# Patient Record
Sex: Male | Born: 1970 | Race: Black or African American | Hispanic: No | Marital: Married | Smoking: Former smoker
Health system: Southern US, Community
[De-identification: ages and names within clinical notes are randomized; demographics above are authoritative.]

## PROBLEM LIST (undated history)

## (undated) DIAGNOSIS — N200 Calculus of kidney: Secondary | ICD-10-CM

## (undated) DIAGNOSIS — Z87442 Personal history of urinary calculi: Secondary | ICD-10-CM

## (undated) HISTORY — PX: URETERAL STENT PLACEMENT: SHX822

---

## 2004-11-02 ENCOUNTER — Emergency Department (HOSPITAL_COMMUNITY): Admission: EM | Admit: 2004-11-02 | Discharge: 2004-11-02 | Payer: Self-pay | Admitting: Emergency Medicine

## 2005-11-03 ENCOUNTER — Emergency Department (HOSPITAL_COMMUNITY): Admission: EM | Admit: 2005-11-03 | Discharge: 2005-11-03 | Payer: Self-pay | Admitting: Emergency Medicine

## 2005-12-08 ENCOUNTER — Emergency Department (HOSPITAL_COMMUNITY): Admission: EM | Admit: 2005-12-08 | Discharge: 2005-12-08 | Payer: Self-pay | Admitting: Emergency Medicine

## 2008-03-29 ENCOUNTER — Emergency Department (HOSPITAL_COMMUNITY): Admission: EM | Admit: 2008-03-29 | Discharge: 2008-03-29 | Payer: Self-pay | Admitting: Emergency Medicine

## 2008-06-25 ENCOUNTER — Emergency Department (HOSPITAL_COMMUNITY): Admission: EM | Admit: 2008-06-25 | Discharge: 2008-06-25 | Payer: Self-pay | Admitting: Emergency Medicine

## 2009-01-21 ENCOUNTER — Emergency Department (HOSPITAL_COMMUNITY): Admission: EM | Admit: 2009-01-21 | Discharge: 2009-01-21 | Payer: Self-pay | Admitting: Emergency Medicine

## 2009-01-23 ENCOUNTER — Emergency Department (HOSPITAL_COMMUNITY): Admission: EM | Admit: 2009-01-23 | Discharge: 2009-01-23 | Payer: Self-pay | Admitting: Emergency Medicine

## 2009-07-17 ENCOUNTER — Emergency Department (HOSPITAL_COMMUNITY): Admission: EM | Admit: 2009-07-17 | Discharge: 2009-07-17 | Payer: Self-pay | Admitting: Emergency Medicine

## 2010-12-06 ENCOUNTER — Emergency Department (HOSPITAL_COMMUNITY)
Admission: EM | Admit: 2010-12-06 | Discharge: 2010-12-06 | Payer: Self-pay | Source: Home / Self Care | Admitting: Emergency Medicine

## 2010-12-11 LAB — URINE MICROSCOPIC-ADD ON

## 2010-12-11 LAB — URINALYSIS, ROUTINE W REFLEX MICROSCOPIC
Bilirubin Urine: NEGATIVE
Ketones, ur: NEGATIVE mg/dL
Leukocytes, UA: NEGATIVE
Nitrite: NEGATIVE
Protein, ur: NEGATIVE mg/dL
Specific Gravity, Urine: >1.03 — ABNORMAL HIGH (ref 1.005–1.030)
Urine Glucose, Fasting: NEGATIVE mg/dL
Urobilinogen, UA: 0.2 mg/dL (ref 0.0–1.0)
pH: 5.5 (ref 5.0–8.0)

## 2010-12-11 LAB — GC/CHLAMYDIA PROBE AMP, GENITAL
Chlamydia, DNA Probe: NEGATIVE
GC Probe Amp, Genital: NEGATIVE

## 2011-01-16 ENCOUNTER — Emergency Department (HOSPITAL_COMMUNITY): Payer: Self-pay

## 2011-01-16 ENCOUNTER — Emergency Department (HOSPITAL_COMMUNITY)
Admission: EM | Admit: 2011-01-16 | Discharge: 2011-01-16 | Disposition: A | Discharge status: Home / Self Care | Payer: Self-pay | Attending: Emergency Medicine | Admitting: Emergency Medicine

## 2011-01-16 DIAGNOSIS — N200 Calculus of kidney: Secondary | ICD-10-CM | POA: Insufficient documentation

## 2011-01-16 DIAGNOSIS — Z87442 Personal history of urinary calculi: Secondary | ICD-10-CM | POA: Insufficient documentation

## 2011-01-16 DIAGNOSIS — R109 Unspecified abdominal pain: Secondary | ICD-10-CM | POA: Insufficient documentation

## 2011-01-16 LAB — BASIC METABOLIC PANEL
BUN: 15 mg/dL (ref 6–23)
CO2: 25 mEq/L (ref 19–32)
Calcium: 9.3 mg/dL (ref 8.4–10.5)
Chloride: 104 mEq/L (ref 96–112)
Creatinine, Ser: 1.32 mg/dL (ref 0.4–1.5)
GFR calc Af Amer: >60 mL/min (ref >60)
GFR calc non Af Amer: >60 mL/min (ref >60)
Glucose, Bld: 97 mg/dL (ref 70–99)
Potassium: 4 mEq/L (ref 3.5–5.1)
Sodium: 138 mEq/L (ref 135–145)

## 2011-01-16 LAB — CBC
HCT: 41.1 % (ref 39.0–52.0)
Hemoglobin: 13.5 g/dL (ref 13.0–17.0)
MCH: 27.6 pg (ref 26.0–34.0)
MCHC: 32.8 g/dL (ref 30.0–36.0)
MCV: 83.9 fL (ref 78.0–100.0)
Platelets: 273 10*3/uL (ref 150–400)
RBC: 4.9 MIL/uL (ref 4.22–5.81)
RDW: 13.7 % (ref 11.5–15.5)
WBC: 13 10*3/uL — ABNORMAL HIGH (ref 4.0–10.5)

## 2011-01-16 LAB — URINE MICROSCOPIC-ADD ON

## 2011-01-16 LAB — DIFFERENTIAL
Basophils Absolute: 0.1 10*3/uL (ref 0.0–0.1)
Basophils Relative: 1 % (ref 0–1)
Eosinophils Absolute: 0.5 10*3/uL (ref 0.0–0.7)
Eosinophils Relative: 4 % (ref 0–5)
Lymphocytes Relative: 27 % (ref 12–46)
Lymphs Abs: 3.6 10*3/uL (ref 0.7–4.0)
Monocytes Absolute: 1.4 10*3/uL — ABNORMAL HIGH (ref 0.1–1.0)
Monocytes Relative: 11 % (ref 3–12)
Neutro Abs: 7.6 10*3/uL (ref 1.7–7.7)
Neutrophils Relative %: 58 % (ref 43–77)

## 2011-01-16 LAB — URINALYSIS, ROUTINE W REFLEX MICROSCOPIC
Bilirubin Urine: NEGATIVE
Ketones, ur: NEGATIVE mg/dL
Leukocytes, UA: NEGATIVE
Nitrite: NEGATIVE
Protein, ur: NEGATIVE mg/dL
Specific Gravity, Urine: 1.022 (ref 1.005–1.030)
Urine Glucose, Fasting: NEGATIVE mg/dL
Urobilinogen, UA: 0.2 mg/dL (ref 0.0–1.0)
pH: 5 (ref 5.0–8.0)

## 2013-07-15 ENCOUNTER — Encounter (HOSPITAL_COMMUNITY): Payer: Self-pay | Admitting: Emergency Medicine

## 2013-07-15 ENCOUNTER — Emergency Department (HOSPITAL_COMMUNITY)
Admission: EM | Admit: 2013-07-15 | Discharge: 2013-07-15 | Disposition: A | Discharge status: Home / Self Care | Payer: BC Managed Care – PPO | Attending: Emergency Medicine | Admitting: Emergency Medicine

## 2013-07-15 DIAGNOSIS — Z87442 Personal history of urinary calculi: Secondary | ICD-10-CM | POA: Insufficient documentation

## 2013-07-15 DIAGNOSIS — M25519 Pain in unspecified shoulder: Secondary | ICD-10-CM | POA: Insufficient documentation

## 2013-07-15 DIAGNOSIS — Z79899 Other long term (current) drug therapy: Secondary | ICD-10-CM | POA: Insufficient documentation

## 2013-07-15 DIAGNOSIS — Z880 Allergy status to penicillin: Secondary | ICD-10-CM | POA: Insufficient documentation

## 2013-07-15 DIAGNOSIS — M2559 Pain in other specified joint: Secondary | ICD-10-CM | POA: Insufficient documentation

## 2013-07-15 HISTORY — DX: Calculus of kidney: N20.0

## 2013-07-15 MED ORDER — IBUPROFEN 800 MG PO TABS: 800.0000 mg | ORAL_TABLET | Freq: Three times a day (TID) | ORAL | Status: DC

## 2013-07-15 NOTE — ED Notes (Signed)
Patient complaining of bilateral shoulder pain x 3 months. States he was seen by PCP and x-rays were negative. Denies injury.

## 2013-07-17 NOTE — ED Provider Notes (Signed)
CSN: [id number]     Arrival date & time 07/15/13  2117 History     First MD Initiated Contact with Patient 07/15/13 2130     Chief Complaint  Patient presents with  . Shoulder Pain   (Consider location/radiation/quality/duration/timing/severity/associated sxs/prior Treatment) HPI Comments: Michael Bruce is a 42 y.o. Male presenting with complaints of pain in his bilateral shoulders which has been worse for the past 3 months and is worse with palpation and with movement and lifting.  He denies injury.  He was seen by his pcp for this complaint one month ago and had xrays which patient reports were normal xrays.  He was given samples and a prescription by his pcp for celebrex which he was reluctant to take because he doesn't like to take medicine and was particularly leery of celebrex after reading the warning label.  He does however take occasional ibuprofen and does feel better after taking this.  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 He denies chest pain and shortness of breath.  Pain is relieved with resting the joints.     The history is provided by the patient.    Past Medical History  Diagnosis Date  . Kidney stones    History reviewed. No pertinent past surgical history. History reviewed. No pertinent family history. History  Substance Use Topics  . Smoking status: Never Smoker   . Smokeless tobacco: Not on file  . Alcohol Use: No    Review of Systems  Constitutional: Negative for fever and chills.  Musculoskeletal: Positive for arthralgias. Negative for myalgias and joint swelling.  Neurological: Negative for weakness and numbness.    Allergies  Penicillins  Home Medications   Current Outpatient Rx  Name  Route  Sig  Dispense  Refill  . ibuprofen (ADVIL,MOTRIN) 200 MG tablet   Oral   Take 200 mg by mouth every 6 (six) hours as needed for pain or fever (for pain).          . Multiple Vitamin (MULTIVITAMIN WITH MINERALS) TABS tablet   Oral   Take 1 tablet by mouth daily.         Marland Kitchen ibuprofen (ADVIL,MOTRIN) 800 MG tablet   Oral   Take 1 tablet (800 mg total) by mouth 3 (three) times daily.   21 tablet   0    BP 126/81  Pulse 74  Temp(Src) 98.2 F (36.8 C) (Oral)  Resp 20  Ht 5\' 7"  (1.702 m)  Wt 168 lb (76.204 kg)  BMI 26.31 kg/m2  SpO2 100% Physical Exam  Constitutional: He appears well-developed and well-nourished.  HENT:  Head: Atraumatic.  Neck: Normal range of motion.  Cardiovascular:  Pulses equal bilaterally  Musculoskeletal: He exhibits tenderness. He exhibits no edema.  Point tender over both AC joints without edema or deformity.  Pt has FROM of shoulders with mild discomfort.  Equal grip strength. Less than 3 sec cap refill.  Neurological: He is alert. He has normal strength. He displays normal reflexes. No sensory deficit.  Equal strength  Skin: Skin is warm and dry.  Psychiatric: He has a normal mood and affect.    ED Course   Procedures (including critical care time)  Labs Reviewed - No data to display No results found. 1. AC joint pain, unspecified laterality     MDM  Suspected chronic ac joint inflammation, pt had normal films by pcp, not repeated today.  He was encouraged to increase ibuprofen dose,  Prescribed 800 mg strength.  Also encouraged heat tx tid.  Recommended recheck by pcp, also given referral to ortho for further eval if sx are not improving with this tx.  Burgess Amor, PA-C 07/17/13 [phone]

## 2013-07-18 NOTE — ED Provider Notes (Signed)
Medical screening examination/treatment/procedure(s) were performed by non-physician practitioner and as supervising physician I was immediately available for consultation/collaboration.   Dagmar Hait, MD 07/18/13 [phone]

## 2018-10-12 ENCOUNTER — Emergency Department (HOSPITAL_COMMUNITY): Payer: BLUE CROSS/BLUE SHIELD

## 2018-10-12 ENCOUNTER — Emergency Department (HOSPITAL_COMMUNITY)
Admission: EM | Admit: 2018-10-12 | Discharge: 2018-10-12 | Disposition: A | Discharge status: Home / Self Care | Payer: BLUE CROSS/BLUE SHIELD | Attending: Emergency Medicine | Admitting: Emergency Medicine

## 2018-10-12 ENCOUNTER — Encounter (HOSPITAL_COMMUNITY): Payer: Self-pay

## 2018-10-12 DIAGNOSIS — R0789 Other chest pain: Secondary | ICD-10-CM | POA: Insufficient documentation

## 2018-10-12 DIAGNOSIS — G44221 Chronic tension-type headache, intractable: Secondary | ICD-10-CM | POA: Diagnosis not present

## 2018-10-12 DIAGNOSIS — G44201 Tension-type headache, unspecified, intractable: Secondary | ICD-10-CM

## 2018-10-12 DIAGNOSIS — R51 Headache: Secondary | ICD-10-CM | POA: Diagnosis not present

## 2018-10-12 LAB — CBC
HCT: 49.3 % (ref 39.0–52.0)
Hemoglobin: 15.4 g/dL (ref 13.0–17.0)
MCH: 27.3 pg (ref 26.0–34.0)
MCHC: 31.2 g/dL (ref 30.0–36.0)
MCV: 87.4 fL (ref 80.0–100.0)
Platelets: 291 10*3/uL (ref 150–400)
RBC: 5.64 MIL/uL (ref 4.22–5.81)
RDW: 13.2 % (ref 11.5–15.5)
WBC: 13 10*3/uL — ABNORMAL HIGH (ref 4.0–10.5)
nRBC: 0 % (ref 0.0–0.2)

## 2018-10-12 LAB — BASIC METABOLIC PANEL
Anion gap: 8 (ref 5–15)
BUN: 11 mg/dL (ref 6–20)
CO2: 24 mmol/L (ref 22–32)
Calcium: 9.4 mg/dL (ref 8.9–10.3)
Chloride: 102 mmol/L (ref 98–111)
Creatinine, Ser: 1.14 mg/dL (ref 0.61–1.24)
GFR calc Af Amer: >60 mL/min (ref >60)
GFR calc non Af Amer: >60 mL/min (ref >60)
Glucose, Bld: 112 mg/dL — ABNORMAL HIGH (ref 70–99)
Potassium: 3.7 mmol/L (ref 3.5–5.1)
Sodium: 134 mmol/L — ABNORMAL LOW (ref 135–145)

## 2018-10-12 LAB — URINALYSIS, ROUTINE W REFLEX MICROSCOPIC
Bilirubin Urine: NEGATIVE
Glucose, UA: NEGATIVE mg/dL
Hgb urine dipstick: NEGATIVE
Ketones, ur: NEGATIVE mg/dL
Leukocytes, UA: NEGATIVE
Nitrite: NEGATIVE
Protein, ur: NEGATIVE mg/dL
Specific Gravity, Urine: 1.028 (ref 1.005–1.030)
pH: 6 (ref 5.0–8.0)

## 2018-10-12 LAB — I-STAT TROPONIN, ED: Troponin i, poc: 0 ng/mL (ref 0.00–0.08)

## 2018-10-12 MED ORDER — METOCLOPRAMIDE HCL 5 MG/ML IJ SOLN
10.0000 mg | Freq: Once | INTRAMUSCULAR | Status: AC
  Administered 2018-10-12: 10 mg via INTRAVENOUS
  Filled 2018-10-12: qty 2

## 2018-10-12 MED ORDER — DEXAMETHASONE SODIUM PHOSPHATE 4 MG/ML IJ SOLN
4.0000 mg | Freq: Once | INTRAMUSCULAR | Status: AC
  Administered 2018-10-12: 4 mg via INTRAVENOUS
  Filled 2018-10-12: qty 1

## 2018-10-12 NOTE — ED Triage Notes (Addendum)
Pt arrived with c/o CP for last few months; yesterday pt states he was fatigued with backache and HA; Pt denies current CP but states CP was centralized and sharp, radiating to neck. Pt states that he took Viagra 3 days ago. Pt states episodes last for approx 5 mins.

## 2018-10-12 NOTE — Discharge Instructions (Addendum)
You were seen in the emergency department today for chest pain and headache. Your work-up in the emergency department has been overall reassuring. Your labs have been fairly normal and or similar to previous blood work you have had done. Your EKG and the enzyme we use to check your heart did not show an acute heart attack at this time. Your chest x-ray was normal. Your head CT was normal.   We would like you to follow up closely with your primary care provider and/or the cardiologist provided in your discharge instructions within 1-3 days. Obtain all your results from this visit and discuss them with your PCP. Take over the counter ibuprofen as needed for headaches. Return to the ER immediately should you experience any new or worsening symptoms including but not limited to return of pain, worsened pain, vomiting, shortness of breath, dizziness, lightheadedness, passing out, or any other concerns that you may have.

## 2018-10-12 NOTE — ED Provider Notes (Signed)
[facility] EMERGENCY DEPARTMENT Provider Note   CSN: [id number] Arrival date & time: 10/12/18  0755     History   Chief Complaint Chief Complaint  Patient presents with  . Chest Pain    HPI Michael Bruce is a 47 y.o. male presenting with a gradual constant headache onset yesterday morning.Patient describes headache as worse than other previous headaches. Patient describes headache as pressure located in the back on his head bilaterally. Patient denies any current neck pain. Patient reports chronic back pain and fatigue. Patient denies taking any medications for his pain. Patient denies vision changes, weakness, or syncope.  Patient denies any recent surgeries or periods of immobilization. Patient denies fever, chills, or night sweats.  Patient also reports he has had intermittent chest pain that radiates to his neck over the last few months. Patient localizes chest pain to the middle of the chest. Patient describes chest pain as sharp episodes that last 5 minutes and resolve spontaneously. Chest pain is non exertional. Last episode of chest pain was yesterday. Patient reports he took Viagra once 3 days ago, but denies taking this medication again. Patient denies any treatment for his chest pain. Patient denies shortness of breath, current chest pain, extremity edema, or palpitations. Patient denies a family history of heart problems. Patient denies tobacco use. Patient reports daily marijuana use, but denies any other drug use.   HPI  Past Medical History:  Diagnosis Date  . Kidney stones     There are no active problems to display for this patient.   History reviewed. No pertinent surgical history.      Home Medications    Prior to Admission medications   Medication Sig Start Date End Date Taking? Authorizing Provider  ibuprofen (ADVIL,MOTRIN) 800 MG tablet Take 1 tablet (800 mg total) by mouth 3 (three) times daily. Patient not taking: Reported on  10/12/2018 07/15/13   Burgess Amor, PA-C    Family History History reviewed. No pertinent family history.  Social History Social History   Tobacco Use  . Smoking status: Never Smoker  Substance Use Topics  . Alcohol use: No  . Drug use: No     Allergies   Penicillins   Review of Systems Review of Systems  Constitutional: Positive for fatigue. Negative for activity change, appetite change, chills, diaphoresis, fever and unexpected weight change.  HENT: Negative for congestion and rhinorrhea.   Eyes: Negative for visual disturbance.  Respiratory: Negative for cough, chest tightness, shortness of breath and wheezing.   Cardiovascular: Positive for chest pain. Negative for palpitations and leg swelling.  Gastrointestinal: Negative for abdominal pain, nausea and vomiting.  Endocrine: Negative for cold intolerance and heat intolerance.  Genitourinary: Positive for frequency. Negative for dysuria.  Musculoskeletal: Positive for back pain. Negative for neck pain and neck stiffness.  Skin: Negative for rash.  Allergic/Immunologic: Negative for immunocompromised state.  Neurological: Positive for headaches. Negative for dizziness, syncope, weakness, light-headedness and numbness.  Psychiatric/Behavioral: Positive for sleep disturbance. Negative for agitation and behavioral problems. The patient is not nervous/anxious.      Physical Exam Updated Vital Signs BP (!) 133/99   Pulse (!) 102   Temp 98.9 F (37.2 C) (Oral)   Resp (!) 23   SpO2 94%   Physical Exam  Constitutional: He is oriented to person, place, and time. He appears well-developed and well-nourished. No distress.  HENT:  Head: Normocephalic and atraumatic.  Neck: Normal range of motion. Neck supple. No JVD present.  Cardiovascular:  Regular rhythm, normal heart sounds and normal pulses. Tachycardia present. Exam reveals no gallop and no friction rub.  No murmur heard. Pulses:      Radial pulses are 2+ on the right  side, and 2+ on the left side.       Dorsalis pedis pulses are 2+ on the right side, and 2+ on the left side.  Pulmonary/Chest: Effort normal and breath sounds normal. No respiratory distress. He has no wheezes. He has no rales. He exhibits no tenderness.  Abdominal: Soft. There is no tenderness.  Musculoskeletal: Normal range of motion.       Cervical back: Normal. He exhibits normal range of motion, no tenderness, no bony tenderness and no swelling.       Lumbar back: Normal. He exhibits normal range of motion, no tenderness, no bony tenderness and no swelling.  Neurological: He is alert and oriented to person, place, and time.  Skin: Capillary refill takes less than 2 seconds. No rash noted. He is not diaphoretic. No pallor.  Psychiatric: He has a normal mood and affect.  Nursing note and vitals reviewed.  Mental Status:  Alert, oriented, thought content appropriate, able to give a coherent history. Speech fluent without evidence of aphasia. Able to follow 2 step commands without difficulty.  Cranial Nerves:  II:  Peripheral visual fields grossly normal, pupils equal, round, reactive to light III,IV, VI: ptosis not present, extra-ocular motions intact bilaterally  V,VII: smile symmetric, facial light touch sensation equal VIII: hearing grossly normal to voice  X: uvula elevates symmetrically  XI: bilateral shoulder shrug symmetric and strong XII: midline tongue extension without fassiculations Motor:  Normal tone. 5/5 in upper and lower extremities bilaterally including strong and equal grip strength and dorsiflexion/plantar flexion Sensory: light touch normal in all extremities.  Cerebellar: normal finger-to-nose with bilateral upper extremities Gait: normal gait and balance.  CV: distal pulses palpable throughout   ED Treatments / Results  Labs (all labs ordered are listed, but only abnormal results are displayed) Labs Reviewed  BASIC METABOLIC PANEL - Abnormal; Notable for the  following components:      Result Value   Sodium 134 (*)    Glucose, Bld 112 (*)    All other components within normal limits  CBC - Abnormal; Notable for the following components:   WBC 13.0 (*)    All other components within normal limits  URINALYSIS, ROUTINE W REFLEX MICROSCOPIC  I-STAT TROPONIN, ED    EKG EKG Interpretation  Date/Time:  Sunday October 12 2018 08:08:54 EST Ventricular Rate:  109 PR Interval:    QRS Duration: 122 QT Interval:  358 QTC Calculation: 483 R Axis:   56 Text Interpretation:  Sinus tachycardia Atrial premature complex Nonspecific intraventricular conduction delay Minimal ST depression, diffuse leads Baseline wander in lead(s) I II aVR V6 still not great data quality, but no STEMI Confirmed by Jacalyn Lefevre [phone]) on 10/12/2018 8:23:31 AM   Radiology Dg Chest 2 View  Result Date: 10/12/2018 CLINICAL DATA:  Chest pain EXAM: CHEST - 2 VIEW COMPARISON:  None. FINDINGS: Lungs are clear. No pleural effusion or pneumothorax. The heart is normal in size. Visualized osseous structures are within normal limits. IMPRESSION: Normal chest radiographs. Electronically Signed   By: Charline Bills M.D.   On: 10/12/2018 08:49   Ct Head Wo Contrast  Result Date: 10/12/2018 CLINICAL DATA:  Headaches EXAM: CT HEAD WITHOUT CONTRAST TECHNIQUE: Contiguous axial images were obtained from the base of the skull through the vertex without  intravenous contrast. COMPARISON:  None. FINDINGS: Brain: No evidence of acute infarction, hemorrhage, hydrocephalus, extra-axial collection or mass lesion/mass effect. Vascular: No hyperdense vessel or unexpected calcification. Skull: Normal. Negative for fracture or focal lesion. Sinuses/Orbits: No acute finding. Other: None IMPRESSION: Normal head CT Electronically Signed   By: Alcide CleverMark  Lukens M.D.   On: 10/12/2018 09:12    Procedures Procedures (including critical care time)  Medications Ordered in ED Medications  metoCLOPramide  (REGLAN) injection 10 mg (10 mg Intravenous Given 10/12/18 0834)  dexamethasone (DECADRON) injection 4 mg (4 mg Intravenous Given 10/12/18 0834)     Initial Impression / Assessment and Plan / ED Course  I have reviewed the triage vital signs and the nursing notes.  Pertinent labs & imaging results that were available during my care of the patient were reviewed by me and considered in my medical decision making (see chart for details).  Clinical Course as of Oct 12 1029  Sun Oct 12, 2018  0903 Normal CXR. No suspicion of pneumonia or pneumothorax causing chest pain.   DG Chest 2 View [AH]  [id number] Patient reports headache has improved with treatment.    [AH]  1028 UA is unremarkable.    [AH]  1028 Leukocytosis elevated likely due to a viral infection. Will encourage patient to follow up with PCP.   WBC(!): 13.0 [AH]    Clinical Course User Index [AH] Leretha DykesHernandez, Carinne Brandenburger P, PA-C   Patient presents with complaint of headache. Patient nontoxic appearing, in no apparent distress, vitals WNL, and stable.   Labs/Imaging: Ordered CT head without contrast due to history. Ordered EKG and troponin to evaluate for ACS. Ordered CBC, BMP, and UA to evaluate for signs of infection or electrolyte abnormalities.   Therapeutics: Provided Reglan and Decadron for pain control.  Assessment/Plan:  Suspect headache is likely benign. Patient has a negative CT and symptoms have resolved with treatment. Will encourage patient to take ibuprofen as needed for headaches. Patient is to be discharged with recommendation to follow up with PCP in regards to today's hospital visit. Chest pain is not likely of cardiac or pulmonary etiology d/t presentation, VSS, no tracheal deviation, no JVD or new murmur, RRR, breath sounds equal bilaterally, EKG without acute abnormalities, negative troponin, and negative CXR. Heart pathway score is 2. Patient did not have an episode of chest pain while in the ER today. Pt has been advised to  return to the ED if CP becomes exertional, associated with diaphoresis or nausea, radiates to left jaw/arm, worsens or becomes concerning in any way. Pt appears reliable for follow up and is agreeable to discharge.   Case has been discussed with and seen by Dr. Particia NearingHaviland who agrees with the above plan to discharge.   Final Clinical Impressions(s) / ED Diagnoses   Final diagnoses:  Acute intractable tension-type headache  Atypical chest pain    ED Discharge Orders    None       Leretha DykesHernandez, Dudley Mages P, [place]PA-C 10/12/18 1030    Jacalyn LefevreHaviland, Julie, MD 10/12/18 1037

## 2018-10-12 NOTE — ED Notes (Signed)
Pt discharged from ED; instructions provided and scripts given; Pt encouraged to return to ED if symptoms worsen and to f/u with PCP; Pt verbalized understanding of all instructions 

## 2018-10-22 ENCOUNTER — Ambulatory Visit (INDEPENDENT_AMBULATORY_CARE_PROVIDER_SITE_OTHER): Payer: BLUE CROSS/BLUE SHIELD | Admitting: Family Medicine

## 2018-10-22 ENCOUNTER — Encounter: Payer: Self-pay | Admitting: Family Medicine

## 2018-10-22 VITALS — BP 135/88 | HR 77 | Temp 97.5°F | Ht 67.0 in | Wt 189.0 lb

## 2018-10-22 DIAGNOSIS — R5383 Other fatigue: Secondary | ICD-10-CM

## 2018-10-22 DIAGNOSIS — Z6829 Body mass index (BMI) 29.0-29.9, adult: Secondary | ICD-10-CM

## 2018-10-22 DIAGNOSIS — Z0000 Encounter for general adult medical examination without abnormal findings: Secondary | ICD-10-CM

## 2018-10-22 NOTE — Progress Notes (Signed)
Subjective:    Patient ID: Michael Bruce, male    DOB: [birth year], 47 y.o.   MRN: [id number]  Chief Complaint:  Annual Exam   HPI: Michael Bruce is a 47 y.o. male presenting on 10/22/2018 for Annual Exam  Pt presents today for his annual physical exam. States he has not had a physical in over three years. States the only significant medical history he has is kidney stones in the past. States he was recently seen in the ED due to headaches He had a negative cardiac and neurological workup. States this has resolved since the visit. Pt reports he has been experiencing ongoing fatigue. States this started over 5 months ago and is constant. States he does have intermittent bilateral leg aching with the fatigue. States this is exacerbated with climbing stairs at work. He is concerned about possible thyroid disease, as his mother has a history of thyroid disease. He denies other concerns or complaints.   Relevant past medical, surgical, family, and social history reviewed and updated as indicated.  Allergies and medications reviewed and updated.   Past Medical History:  Diagnosis Date  . Kidney stones     Past Surgical History:  Procedure Laterality Date  . URETERAL STENT PLACEMENT      Social History   Socioeconomic History  . Marital status: Married    Spouse name: Not on file  . Number of children: Not on file  . Years of education: Not on file  . Highest education level: Not on file  Occupational History  . Not on file  Social Needs  . Financial resource strain: Not on file  . Food insecurity:    Worry: Not on file    Inability: Not on file  . Transportation needs:    Medical: Not on file    Non-medical: Not on file  Tobacco Use  . Smoking status: Former Smoker    Packs/day: 0.25    Years: 5.00    Pack years: 1.25    Last attempt to quit: 11/27/2015    Years since quitting: 2.9  . Smokeless tobacco: Never Used  Substance and Sexual Activity  . Alcohol use:  No  . Drug use: Yes    Types: Marijuana    Comment: rarely   . Sexual activity: Yes    Birth control/protection: None  Lifestyle  . Physical activity:    Days per week: Not on file    Minutes per session: Not on file  . Stress: Not on file  Relationships  . Social connections:    Talks on phone: Not on file    Gets together: Not on file    Attends religious service: Not on file    Active member of club or organization: Not on file    Attends meetings of clubs or organizations: Not on file    Relationship status: Not on file  . Intimate partner violence:    Fear of current or ex partner: Not on file    Emotionally abused: Not on file    Physically abused: Not on file    Forced sexual activity: Not on file  Other Topics Concern  . Not on file  Social History Narrative  . Not on file    Outpatient Encounter Medications as of 10/22/2018  Medication Sig  . [DISCONTINUED] ibuprofen (ADVIL,MOTRIN) 800 MG tablet Take 1 tablet (800 mg total) by mouth 3 (three) times daily. (Patient not taking: Reported on 10/12/2018)   No facility-administered  encounter medications on file as of 10/22/2018.     Allergies  Allergen Reactions  . Penicillins Other (See Comments)    Has patient had a PCN reaction causing immediate rash, facial/tongue/throat swelling, SOB or lightheadedness with hypotension: No Has patient had a PCN reaction causing severe rash involving mucus membranes or skin necrosis: No Has patient had a PCN reaction that required hospitalization: No Has patient had a PCN reaction occurring within the last 10 years: No If all of the above answers are "NO", then may proceed with Cephalosporin use.    Review of Systems  Constitutional: Positive for activity change and fatigue. Negative for chills and fever.  HENT: Negative for congestion and sinus pressure.   Eyes: Negative for photophobia and visual disturbance.  Respiratory: Negative for cough, chest tightness, shortness of  breath and wheezing.   Cardiovascular: Negative for chest pain, palpitations and leg swelling.  Gastrointestinal: Negative for abdominal pain, constipation, diarrhea, nausea, rectal pain and vomiting.  Endocrine: Negative for cold intolerance, heat intolerance, polydipsia, polyphagia and polyuria.  Genitourinary: Negative for decreased urine volume, difficulty urinating, discharge, dysuria, enuresis, flank pain, frequency, genital sores, hematuria, penile pain, penile swelling, scrotal swelling, testicular pain and urgency.  Musculoskeletal: Positive for myalgias.  Skin: Negative.   Neurological: Positive for headaches. Negative for dizziness, tremors, seizures, syncope, facial asymmetry, speech difficulty, weakness, light-headedness and numbness.  Psychiatric/Behavioral: Negative for behavioral problems, confusion and sleep disturbance.  All other systems reviewed and are negative.       Objective:    BP 135/88   Pulse 77   Temp (!) 97.5 F (36.4 C) (Oral)   Ht 5' 7"  (1.702 m)   Wt 189 lb (85.7 kg)   BMI 29.60 kg/m    Wt Readings from Last 3 Encounters:  10/22/18 189 lb (85.7 kg)  07/15/13 168 lb (76.2 kg)    Physical Exam  Constitutional: He is oriented to person, place, and time. He appears well-developed and well-nourished. He is cooperative. No distress.  HENT:  Head: Normocephalic.  Right Ear: Hearing, tympanic membrane, external ear and ear canal normal.  Left Ear: Hearing, tympanic membrane, external ear and ear canal normal.  Nose: Nose normal.  Mouth/Throat: Uvula is midline, oropharynx is clear and moist and mucous membranes are normal. No tonsillar exudate.  Eyes: Pupils are equal, round, and reactive to light. Conjunctivae, EOM and lids are normal.  Neck: Trachea normal, full passive range of motion without pain and phonation normal. Neck supple. No JVD present. Carotid bruit is not present. No thyroid mass and no thyromegaly present.  Cardiovascular: Normal rate,  regular rhythm, normal heart sounds and normal pulses. Exam reveals no gallop and no friction rub.  No murmur heard. Pulmonary/Chest: Effort normal and breath sounds normal. No respiratory distress.  Abdominal: Soft. Normal appearance, normal aorta and bowel sounds are normal. There is no hepatosplenomegaly. There is no tenderness. There is no CVA tenderness. No hernia.  Musculoskeletal: Normal range of motion.  Lymphadenopathy:    He has no cervical adenopathy.    He has no axillary adenopathy.  Neurological: He is alert and oriented to person, place, and time. He has normal strength and normal reflexes. No cranial nerve deficit or sensory deficit. He displays a negative Romberg sign.  Skin: Skin is warm, dry and intact. Capillary refill takes less than 2 seconds.  Psychiatric: He has a normal mood and affect. His speech is normal and behavior is normal. Judgment and thought content normal. Cognition and memory are  normal.  Nursing note and vitals reviewed.   Results for orders placed or performed during the hospital encounter of 19/37/90  Basic metabolic panel  Result Value Ref Range   Sodium 134 (L) 135 - 145 mmol/L   Potassium 3.7 3.5 - 5.1 mmol/L   Chloride 102 98 - 111 mmol/L   CO2 24 22 - 32 mmol/L   Glucose, Bld 112 (H) 70 - 99 mg/dL   BUN 11 6 - 20 mg/dL   Creatinine, Ser 1.14 0.61 - 1.24 mg/dL   Calcium 9.4 8.9 - 10.3 mg/dL   GFR calc non Af Amer >60 >60 mL/min   GFR calc Af Amer >60 >60 mL/min   Anion gap 8 5 - 15  CBC  Result Value Ref Range   WBC 13.0 (H) 4.0 - 10.5 K/uL   RBC 5.64 4.22 - 5.81 MIL/uL   Hemoglobin 15.4 13.0 - 17.0 g/dL   HCT 49.3 39.0 - 52.0 %   MCV 87.4 80.0 - 100.0 fL   MCH 27.3 26.0 - 34.0 pg   MCHC 31.2 30.0 - 36.0 g/dL   RDW 13.2 11.5 - 15.5 %   Platelets 291 150 - 400 K/uL   nRBC 0.0 0.0 - 0.2 %  Urinalysis, Routine w reflex microscopic  Result Value Ref Range   Color, Urine YELLOW YELLOW   APPearance CLEAR CLEAR   Specific Gravity, Urine  1.028 1.005 - 1.030   pH 6.0 5.0 - 8.0   Glucose, UA NEGATIVE NEGATIVE mg/dL   Hgb urine dipstick NEGATIVE NEGATIVE   Bilirubin Urine NEGATIVE NEGATIVE   Ketones, ur NEGATIVE NEGATIVE mg/dL   Protein, ur NEGATIVE NEGATIVE mg/dL   Nitrite NEGATIVE NEGATIVE   Leukocytes, UA NEGATIVE NEGATIVE  I-stat troponin, ED  Result Value Ref Range   Troponin i, poc 0.00 0.00 - 0.08 ng/mL   Comment 3               Pertinent labs & imaging results that were available during my care of the patient were reviewed by me and considered in my medical decision making.  Assessment & Plan:  June was seen today for annual exam.  Diagnoses and all orders for this visit:  Annual physical exam Health Maintenance discussed. Diet and exercise encouraged.  -     CMP14+EGFR -     CBC with Differential/Platelet -     HIV Antibody (routine testing w rflx) -     Lipid panel -     TSH -     Cancel: Testosterone,Free and Total  Other fatigue -     CMP14+EGFR -     CBC with Differential/Platelet -     Lipid panel -     TSH -     Cancel: Testosterone,Free and Total -     VITAMIN D 25 Hydroxy (Vit-D Deficiency, Fractures) -     Vitamin B12  BMI 29.0-29.9,adult Diet and exercise encouraged. Weight management discussed.  -     CMP14+EGFR -     Lipid panel -     TSH   Blood pressure noted to be elevated during office visit. Pt provided a blood pressure log and is to check randomly at home and bring to next visit. DASH diet discussed.   Continue all other maintenance medications.  Follow up plan: Return in about 3 months (around 01/22/2019), or if symptoms worsen or fail to improve.  Educational handout given for health maintenance  The above assessment and management plan was discussed  with the patient. The patient verbalized understanding of and has agreed to the management plan. Patient is aware to call the clinic if symptoms persist or worsen. Patient is aware when to return to the clinic for a  follow-up visit. Patient educated on when it is appropriate to go to the emergency department.   Monia Pouch, FNP-C [facility] [phone]

## 2018-10-22 NOTE — Patient Instructions (Signed)

## 2018-10-23 ENCOUNTER — Other Ambulatory Visit: Payer: Self-pay | Admitting: Family Medicine

## 2018-10-23 DIAGNOSIS — E559 Vitamin D deficiency, unspecified: Secondary | ICD-10-CM | POA: Insufficient documentation

## 2018-10-23 LAB — CMP14+EGFR
ALK PHOS: 63 IU/L (ref 39–117)
ALT: 40 IU/L (ref 0–44)
AST: 18 IU/L (ref 0–40)
Albumin/Globulin Ratio: 1.9 (ref 1.2–2.2)
Albumin: 4.6 g/dL (ref 3.5–5.5)
BUN / CREAT RATIO: 14 (ref 9–20)
BUN: 14 mg/dL (ref 6–24)
Bilirubin Total: 0.2 mg/dL (ref 0.0–1.2)
CO2: 23 mmol/L (ref 20–29)
Calcium: 9.9 mg/dL (ref 8.7–10.2)
Chloride: 101 mmol/L (ref 96–106)
Creatinine, Ser: 0.98 mg/dL (ref 0.76–1.27)
GFR calc Af Amer: 106 mL/min/{1.73_m2} (ref >59)
GFR calc non Af Amer: 92 mL/min/{1.73_m2} (ref >59)
Globulin, Total: 2.4 g/dL (ref 1.5–4.5)
Glucose: 87 mg/dL (ref 65–99)
Potassium: 4.9 mmol/L (ref 3.5–5.2)
SODIUM: 139 mmol/L (ref 134–144)
Total Protein: 7 g/dL (ref 6.0–8.5)

## 2018-10-23 LAB — CBC WITH DIFFERENTIAL/PLATELET
Basophils Absolute: 0.1 10*3/uL (ref 0.0–0.2)
Basos: 1 %
EOS (ABSOLUTE): 0.2 10*3/uL (ref 0.0–0.4)
EOS: 3 %
HEMATOCRIT: 37.5 % (ref 37.5–51.0)
HEMOGLOBIN: 14 g/dL (ref 13.0–17.7)
Immature Grans (Abs): 0 10*3/uL (ref 0.0–0.1)
Immature Granulocytes: 0 %
LYMPHS ABS: 2.7 10*3/uL (ref 0.7–3.1)
Lymphs: 33 %
MCH: 32.6 pg (ref 26.6–33.0)
MCHC: 37.3 g/dL — AB (ref 31.5–35.7)
MCV: 87 fL (ref 79–97)
MONOS ABS: 0.6 10*3/uL (ref 0.1–0.9)
Monocytes: 7 %
NEUTROS ABS: 4.6 10*3/uL (ref 1.4–7.0)
Neutrophils: 56 %
Platelets: 364 10*3/uL (ref 150–450)
RBC: 4.29 x10E6/uL (ref 4.14–5.80)
RDW: 13.9 % (ref 12.3–15.4)
WBC: 8.2 10*3/uL (ref 3.4–10.8)

## 2018-10-23 LAB — VITAMIN B12: Vitamin B-12: 630 pg/mL (ref 232–1245)

## 2018-10-23 LAB — LIPID PANEL
Chol/HDL Ratio: 5.2 ratio — ABNORMAL HIGH (ref 0.0–5.0)
Cholesterol, Total: 134 mg/dL (ref 100–199)
HDL: 26 mg/dL — ABNORMAL LOW (ref >39)
LDL CALC: 84 mg/dL (ref 0–99)
Triglycerides: 119 mg/dL (ref 0–149)
VLDL CHOLESTEROL CAL: 24 mg/dL (ref 5–40)

## 2018-10-23 LAB — HIV ANTIBODY (ROUTINE TESTING W REFLEX): HIV Screen 4th Generation wRfx: NONREACTIVE

## 2018-10-23 LAB — TSH: TSH: 0.925 u[IU]/mL (ref 0.450–4.500)

## 2018-10-23 LAB — VITAMIN D 25 HYDROXY (VIT D DEFICIENCY, FRACTURES): VIT D 25 HYDROXY: 17.3 ng/mL — AB (ref 30.0–100.0)

## 2018-10-23 MED ORDER — VITAMIN D 1000 UNITS PO TABS: 1000.0000 [IU] | ORAL_TABLET | Freq: Every day | ORAL | 6 refills | Status: DC

## 2018-10-27 ENCOUNTER — Encounter: Payer: Self-pay | Admitting: Family

## 2019-10-16 ENCOUNTER — Other Ambulatory Visit: Payer: Self-pay

## 2019-10-16 DIAGNOSIS — Z20822 Contact with and (suspected) exposure to covid-19: Secondary | ICD-10-CM

## 2019-10-19 LAB — NOVEL CORONAVIRUS, NAA: SARS-CoV-2, NAA: NOT DETECTED

## 2020-05-31 ENCOUNTER — Encounter: Payer: Self-pay | Admitting: Family

## 2020-05-31 ENCOUNTER — Ambulatory Visit (INDEPENDENT_AMBULATORY_CARE_PROVIDER_SITE_OTHER): Payer: Self-pay | Admitting: Family

## 2020-05-31 ENCOUNTER — Other Ambulatory Visit: Payer: Self-pay

## 2020-05-31 VITALS — BP 126/81 | HR 82 | Temp 97.6°F | Ht 67.0 in | Wt 178.8 lb

## 2020-05-31 DIAGNOSIS — Z0001 Encounter for general adult medical examination with abnormal findings: Secondary | ICD-10-CM

## 2020-05-31 DIAGNOSIS — R5383 Other fatigue: Secondary | ICD-10-CM

## 2020-05-31 DIAGNOSIS — Z0000 Encounter for general adult medical examination without abnormal findings: Secondary | ICD-10-CM

## 2020-05-31 DIAGNOSIS — B353 Tinea pedis: Secondary | ICD-10-CM

## 2020-05-31 IMAGING — CT CT HEAD W/O CM
3 series · 16 of 47 positions shown, 19 images · non-contrast
Comparison: None.

CLINICAL DATA: Headaches

EXAM:
CT HEAD WITHOUT CONTRAST
TECHNIQUE: Contiguous axial images were obtained from the base of the skull
through the vertex without intravenous contrast.

[Series 3: head 5.0 h30s · axial · 0.44mm/px · z∈[-114,+31]mm · 10 of 35 slices shown, 13 images]
[im 3/35  brain]
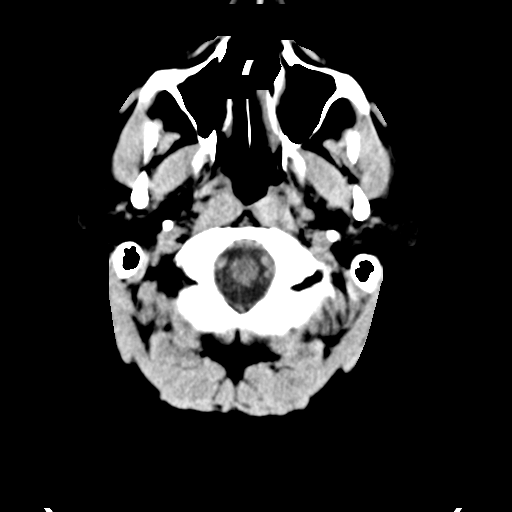
[im 3/35  bone]
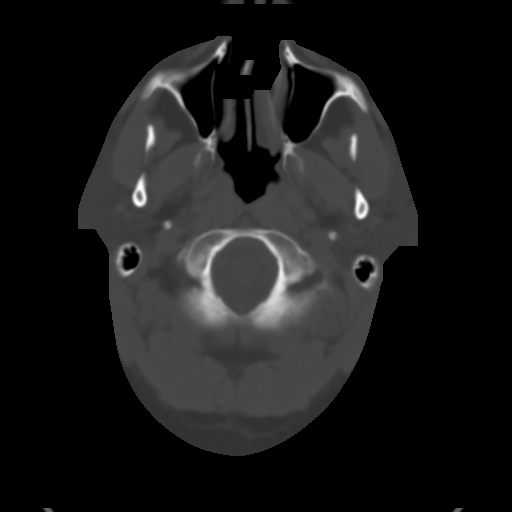
[im 6/35  brain]
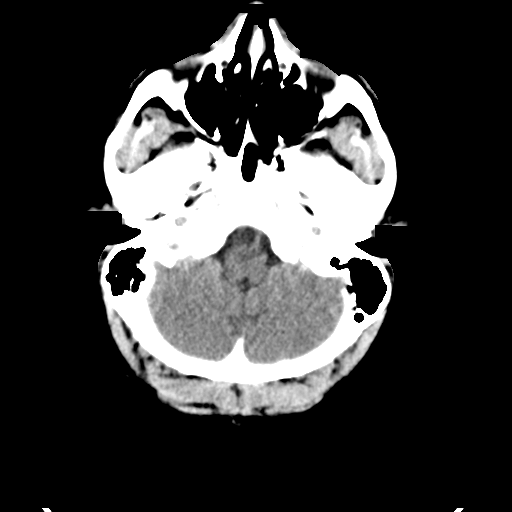
[im 10/35  brain]
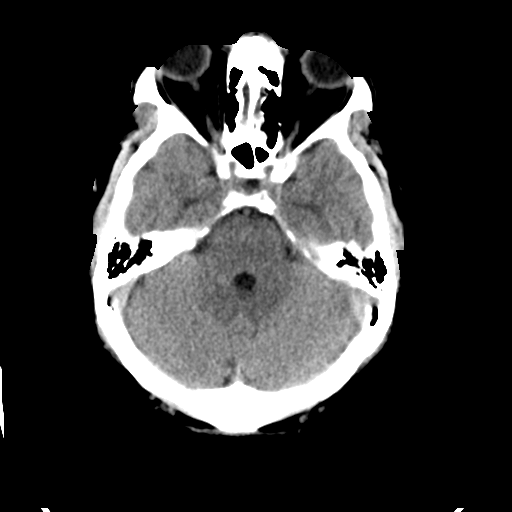
[im 12/35  brain]
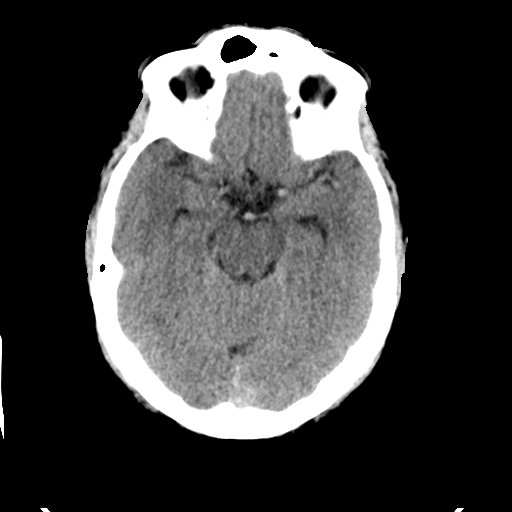
[im 16/35  brain]
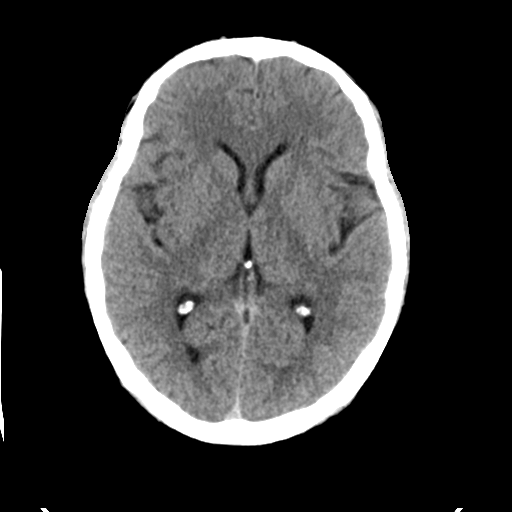
[im 16/35  bone]
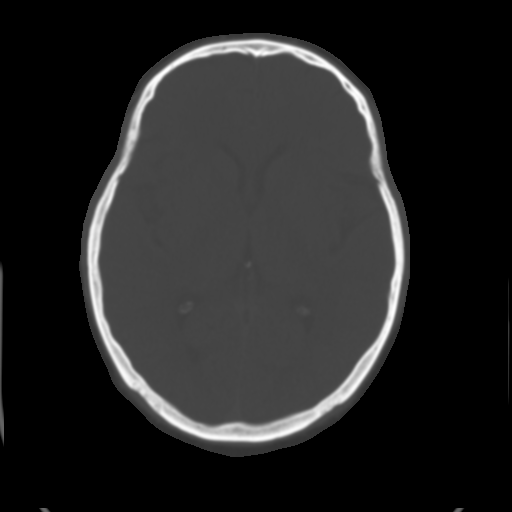
[im 19/35  brain]
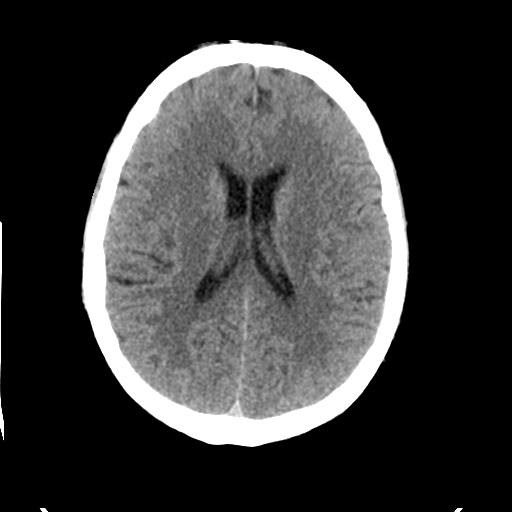
[im 23/35  brain]
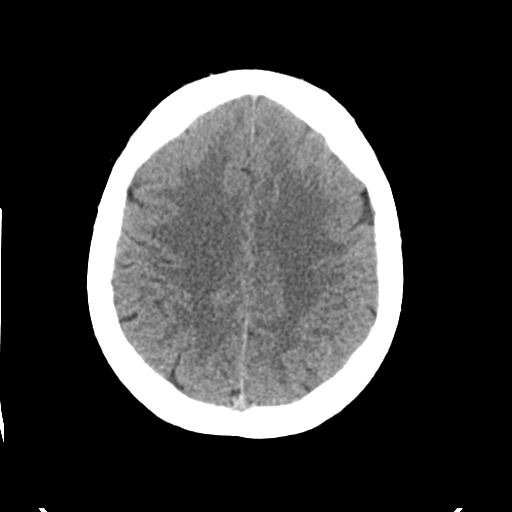
[im 26/35  brain]
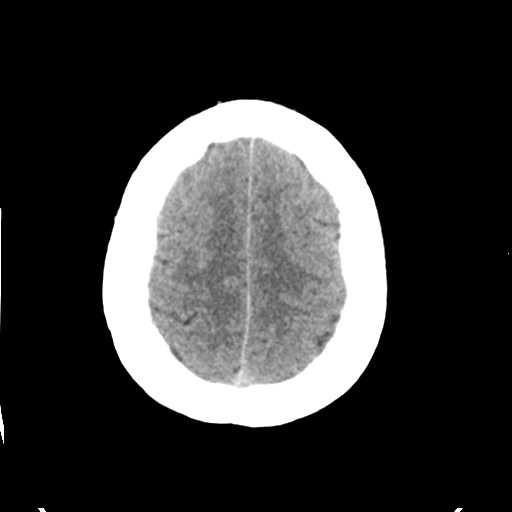
[im 29/35  brain]
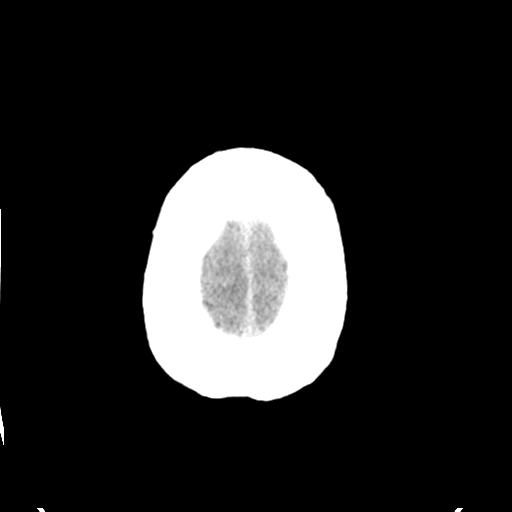
[im 29/35  bone]
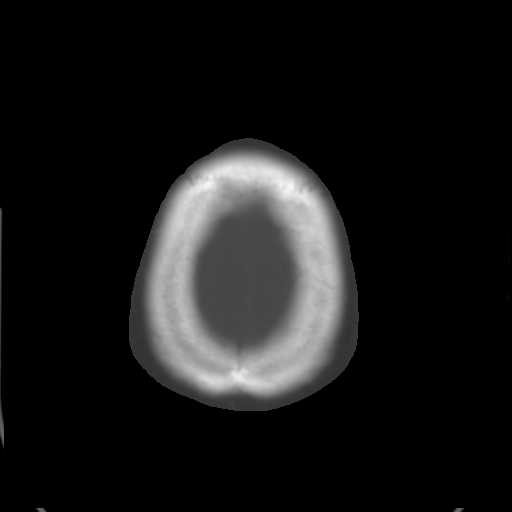
[im 32/35  brain]
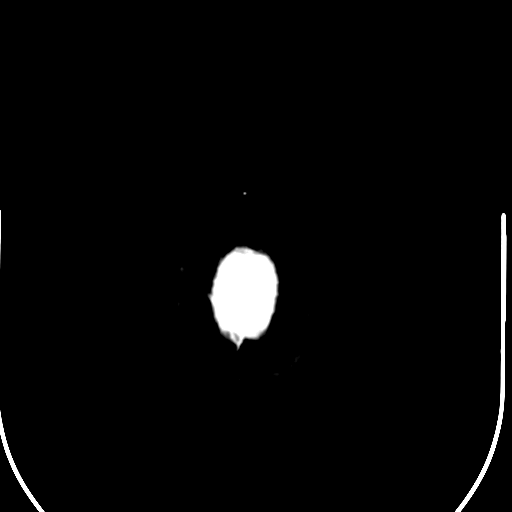

[Series 5: head 3.0 mpr cor · coronal · 0.34mm/px · 3 of 73 slices shown]
[im 25/73  brain]
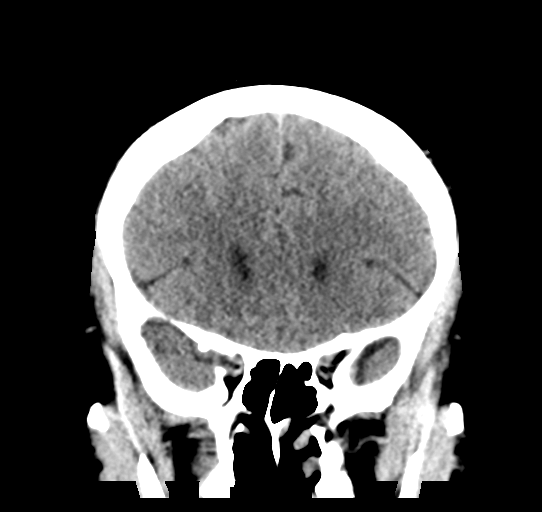
[im 33/73  brain]
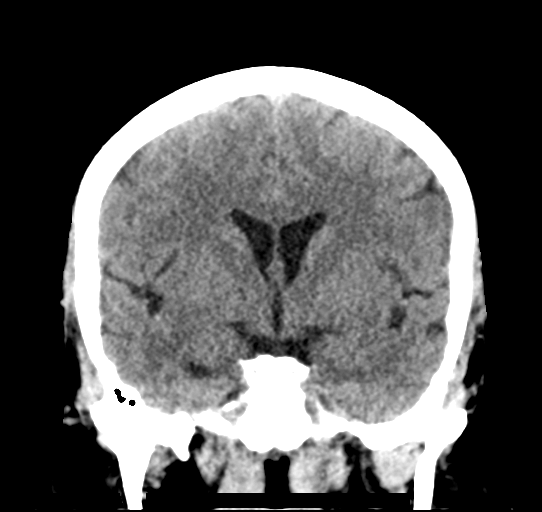
[im 41/73  brain]
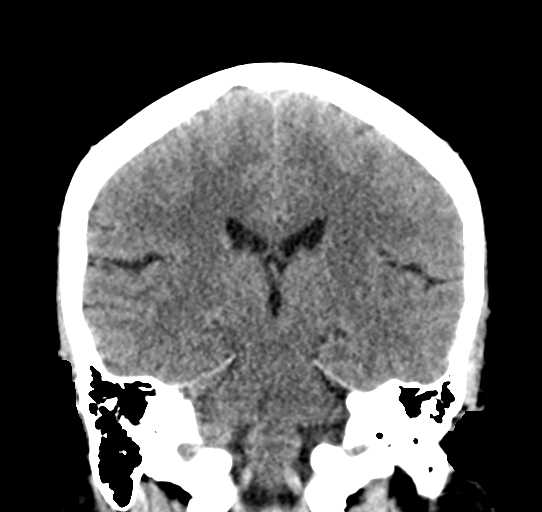

[Series 6: head 3.0 mpr sag · sagittal · 0.40mm/px · 3 of 67 slices shown]
[im 23/67  brain]
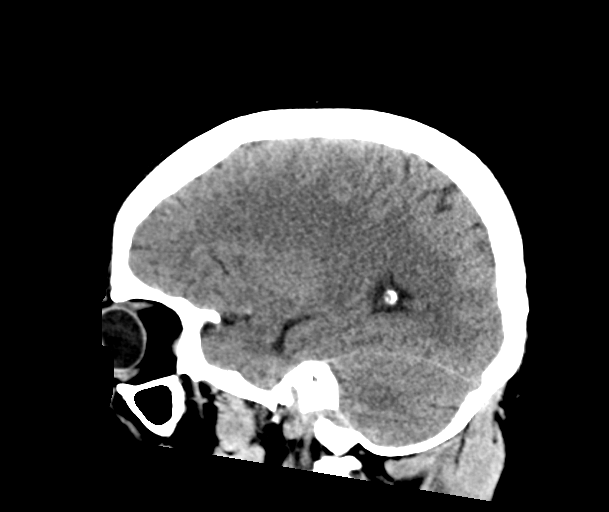
[im 34/67  brain]
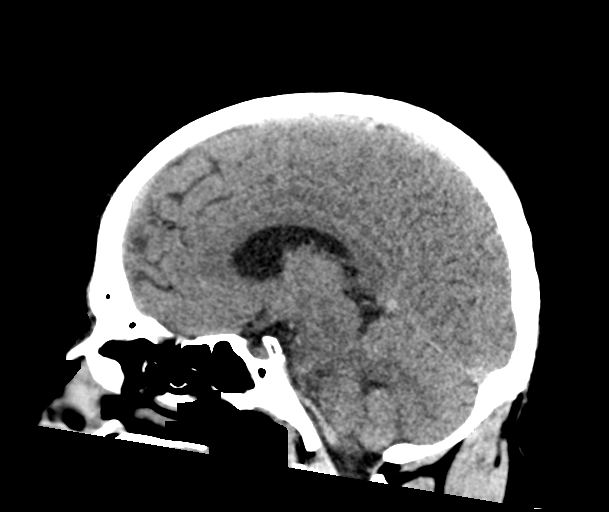
[im 45/67  brain]
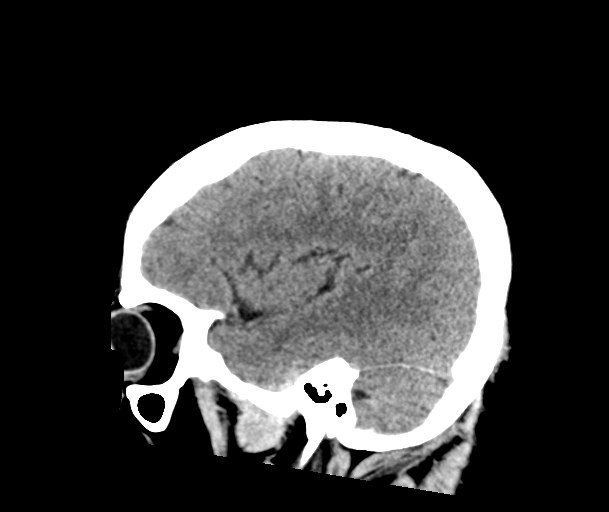

[16 of 47 positions shown; findings below may reference images not displayed]

FINDINGS: Brain: No evidence of acute infarction, hemorrhage, hydrocephalus,
extra-axial collection or mass lesion/mass effect.

Vascular: No hyperdense vessel or unexpected calcification.

Skull: Normal. Negative for fracture or focal lesion.

Sinuses/Orbits: No acute finding.

Other: None
IMPRESSION: Normal head CT

## 2020-05-31 MED ORDER — KETOCONAZOLE 2 % EX CREA: 1.0000 "application " | TOPICAL_CREAM | Freq: Every day | CUTANEOUS | 0 refills | Status: DC

## 2020-05-31 NOTE — Patient Instructions (Signed)

## 2020-05-31 NOTE — Progress Notes (Signed)
Subjective:    Patient ID: Michael Bruce, male    DOB: 1971-08-17, 49 y.o.   MRN: [id number]  Chief Complaint  Patient presents with  . Annual Exam    Fatigue, feet itch, legs cramping    Pt presents to the office today CPE and complaints of fatigue. He states he just feels more tired lately.  He is also complaining of bilateral rash in between his toes and feet that comes and goes over the last year.  Rash This is a recurrent problem. The current episode started more than 1 year ago. The problem has been waxing and waning since onset. The affected locations include the left foot and right foot. The rash is characterized by redness and itchiness. Treatments tried: antifungal. The treatment provided moderate relief.      Review of Systems  Skin: Positive for rash.  All other systems reviewed and are negative.  Family History  Problem Relation Age of Onset  . Thyroid disease Mother   . Heart disease Father        pacemaker  . Diabetes Father   . Heart disease Maternal Grandfather    Social History   Socioeconomic History  . Marital status: Married    Spouse name: Not on file  . Number of children: Not on file  . Years of education: Not on file  . Highest education level: Not on file  Occupational History  . Not on file  Tobacco Use  . Smoking status: Former Smoker    Packs/day: 0.25    Years: 5.00    Pack years: 1.25    Quit date: 11/27/2015    Years since quitting: 4.5  . Smokeless tobacco: Never Used  Vaping Use  . Vaping Use: Former  . Substances: Flavoring  Substance and Sexual Activity  . Alcohol use: No  . Drug use: Yes    Types: Marijuana    Comment: rarely   . Sexual activity: Yes    Birth control/protection: None  Other Topics Concern  . Not on file  Social History Narrative  . Not on file   Social Determinants of Health   Financial Resource Strain:   . Difficulty of Paying Living Expenses:   Food Insecurity:   . Worried About Sales executive in the Last Year:   . Arboriculturist in the Last Year:   Transportation Needs:   . Film/video editor (Medical):   Marland Kitchen Lack of Transportation (Non-Medical):   Physical Activity:   . Days of Exercise per Week:   . Minutes of Exercise per Session:   Stress:   . Feeling of Stress :   Social Connections:   . Frequency of Communication with Friends and Family:   . Frequency of Social Gatherings with Friends and Family:   . Attends Religious Services:   . Active Member of Clubs or Organizations:   . Attends Archivist Meetings:   Marland Kitchen Marital Status:   Intimate Partner Violence:   . Fear of Current or Ex-Partner:   . Emotionally Abused:   Marland Kitchen Physically Abused:   . Sexually Abused:        Objective:   Physical Exam Vitals reviewed.  Constitutional:      General: He is not in acute distress.    Appearance: He is well-developed.  HENT:     Head: Normocephalic.     Right Ear: Tympanic membrane normal.     Left Ear: Tympanic membrane normal.  Eyes:     General:        Right eye: No discharge.        Left eye: No discharge.     Pupils: Pupils are equal, round, and reactive to light.  Neck:     Thyroid: No thyromegaly.  Cardiovascular:     Rate and Rhythm: Normal rate and regular rhythm.     Heart sounds: Normal heart sounds. No murmur heard.   Pulmonary:     Effort: Pulmonary effort is normal. No respiratory distress.     Breath sounds: Normal breath sounds. No wheezing.  Abdominal:     General: Bowel sounds are normal. There is no distension.     Palpations: Abdomen is soft.     Tenderness: There is no abdominal tenderness.  Musculoskeletal:        General: No tenderness. Normal range of motion.     Cervical back: Normal range of motion and neck supple.  Skin:    General: Skin is warm and dry.     Findings: No erythema or rash.  Neurological:     Mental Status: He is alert and oriented to person, place, and time.     Cranial Nerves: No cranial nerve  deficit.     Deep Tendon Reflexes: Reflexes are normal and symmetric.  Psychiatric:        Behavior: Behavior normal.        Thought Content: Thought content normal.        Judgment: Judgment normal.      BP 126/81   Pulse 82   Temp 97.6 F (36.4 C) (Temporal)   Ht _0  (1.702 m)   Wt 178 lb 12.8 oz (81.1 kg)   BMI 28.00 kg/m       Assessment & Plan:  MARVELLE CAUDILL comes in today with chief complaint of Annual Exam (Fatigue, feet itch, legs cramping)   Diagnosis and orders addressed:  1. Annual physical exam - CMP14+EGFR - CBC with Differential/Platelet - Lipid panel - PSA, total and free - TSH  2. Fatigue, unspecified type - CMP14+EGFR - CBC with Differential/Platelet  3. Tinea pedis of both feet Discussed getting a shoe dryer to help dry out his shoes after working  Do not scratch Keep clean and dry Good hand hygiene discussed after touching feet Do not walk barefoot - CMP14+EGFR - CBC with Differential/Platelet - ketoconazole (NIZORAL) 2 % cream; Apply 1 application topically daily.  Dispense: 15 g; Refill: 0   Labs pending Health Maintenance reviewed Diet and exercise encouraged  Follow up plan: 1 year    Evelina Dun, FNP

## 2020-06-01 LAB — CBC WITH DIFFERENTIAL/PLATELET
Basophils Absolute: 0.1 10*3/uL (ref 0.0–0.2)
Basos: 1 %
EOS (ABSOLUTE): 0.3 10*3/uL (ref 0.0–0.4)
Eos: 3 %
Hematocrit: 41.4 % (ref 37.5–51.0)
Hemoglobin: 13.9 g/dL (ref 13.0–17.7)
Immature Grans (Abs): 0 10*3/uL (ref 0.0–0.1)
Immature Granulocytes: 0 %
Lymphocytes Absolute: 2.7 10*3/uL (ref 0.7–3.1)
Lymphs: 30 %
MCH: 28.5 pg (ref 26.6–33.0)
MCHC: 33.6 g/dL (ref 31.5–35.7)
MCV: 85 fL (ref 79–97)
Monocytes Absolute: 0.7 10*3/uL (ref 0.1–0.9)
Monocytes: 8 %
Neutrophils Absolute: 5.3 10*3/uL (ref 1.4–7.0)
Neutrophils: 58 %
Platelets: 307 10*3/uL (ref 150–450)
RBC: 4.87 x10E6/uL (ref 4.14–5.80)
RDW: 12.8 % (ref 11.6–15.4)
WBC: 9 10*3/uL (ref 3.4–10.8)

## 2020-06-01 LAB — CMP14+EGFR
ALT: 19 IU/L (ref 0–44)
AST: 17 IU/L (ref 0–40)
Albumin/Globulin Ratio: 1.6 (ref 1.2–2.2)
Albumin: 4.2 g/dL (ref 4.0–5.0)
Alkaline Phosphatase: 70 IU/L (ref 48–121)
BUN/Creatinine Ratio: 16 (ref 9–20)
BUN: 14 mg/dL (ref 6–24)
Bilirubin Total: <0.2 mg/dL (ref 0.0–1.2)
CO2: 20 mmol/L (ref 20–29)
Calcium: 9.1 mg/dL (ref 8.7–10.2)
Chloride: 104 mmol/L (ref 96–106)
Creatinine, Ser: 0.88 mg/dL (ref 0.76–1.27)
GFR calc Af Amer: 117 mL/min/{1.73_m2} (ref >59)
GFR calc non Af Amer: 102 mL/min/{1.73_m2} (ref >59)
Globulin, Total: 2.6 g/dL (ref 1.5–4.5)
Glucose: 102 mg/dL — ABNORMAL HIGH (ref 65–99)
Potassium: 4.3 mmol/L (ref 3.5–5.2)
Sodium: 139 mmol/L (ref 134–144)
Total Protein: 6.8 g/dL (ref 6.0–8.5)

## 2020-06-01 LAB — TSH: TSH: 1.18 u[IU]/mL (ref 0.450–4.500)

## 2020-06-01 LAB — PSA, TOTAL AND FREE
PSA, Free Pct: 22.4 %
PSA, Free: 0.38 ng/mL
Prostate Specific Ag, Serum: 1.7 ng/mL (ref 0.0–4.0)

## 2020-06-01 LAB — LIPID PANEL
Chol/HDL Ratio: 7.5 ratio — ABNORMAL HIGH (ref 0.0–5.0)
Cholesterol, Total: 157 mg/dL (ref 100–199)
HDL: 21 mg/dL — ABNORMAL LOW (ref >39)
LDL Chol Calc (NIH): 53 mg/dL (ref 0–99)
Triglycerides: 564 mg/dL (ref 0–149)
VLDL Cholesterol Cal: 83 mg/dL — ABNORMAL HIGH (ref 5–40)

## 2020-06-02 ENCOUNTER — Other Ambulatory Visit: Payer: Self-pay | Admitting: Family

## 2020-06-02 DIAGNOSIS — E781 Pure hyperglyceridemia: Secondary | ICD-10-CM

## 2020-08-05 ENCOUNTER — Other Ambulatory Visit: Payer: Self-pay | Admitting: Family

## 2020-08-05 DIAGNOSIS — B353 Tinea pedis: Secondary | ICD-10-CM

## 2020-08-10 ENCOUNTER — Telehealth: Payer: Self-pay | Admitting: Family

## 2020-08-10 ENCOUNTER — Telehealth: Payer: Self-pay | Admitting: Family Medicine

## 2020-08-10 MED ORDER — ATORVASTATIN CALCIUM 20 MG PO TABS
20.0000 mg | ORAL_TABLET | Freq: Every day | ORAL | 3 refills | Status: DC
Start: 2020-08-10 — End: 2024-09-29

## 2020-08-10 NOTE — Telephone Encounter (Signed)
Please send Lipitor rx to [facility] again per lab results

## 2020-08-10 NOTE — Telephone Encounter (Signed)
Patient aware.

## 2020-08-10 NOTE — Telephone Encounter (Signed)
Rx sent 

## 2021-08-15 NOTE — Progress Notes (Unsigned)
Note ok per MMM

## 2021-12-06 NOTE — Telephone Encounter (Signed)
Will close encounter

## 2024-05-20 ENCOUNTER — Emergency Department (HOSPITAL_COMMUNITY)

## 2024-05-20 ENCOUNTER — Encounter (HOSPITAL_COMMUNITY): Payer: Self-pay

## 2024-05-20 ENCOUNTER — Other Ambulatory Visit: Payer: Self-pay

## 2024-05-20 ENCOUNTER — Emergency Department (HOSPITAL_COMMUNITY)
Admission: EM | Admit: 2024-05-20 | Discharge: 2024-05-20 | Disposition: A | Discharge status: Home / Self Care | Attending: Emergency Medicine | Admitting: Emergency Medicine

## 2024-05-20 DIAGNOSIS — Z87891 Personal history of nicotine dependence: Secondary | ICD-10-CM | POA: Diagnosis not present

## 2024-05-20 DIAGNOSIS — N200 Calculus of kidney: Secondary | ICD-10-CM | POA: Insufficient documentation

## 2024-05-20 DIAGNOSIS — N132 Hydronephrosis with renal and ureteral calculous obstruction: Secondary | ICD-10-CM | POA: Diagnosis not present

## 2024-05-20 DIAGNOSIS — R1031 Right lower quadrant pain: Secondary | ICD-10-CM | POA: Diagnosis present

## 2024-05-20 DIAGNOSIS — R109 Unspecified abdominal pain: Secondary | ICD-10-CM | POA: Diagnosis not present

## 2024-05-20 LAB — COMPREHENSIVE METABOLIC PANEL WITH GFR
ALT: 29 U/L (ref 0–44)
AST: 22 U/L (ref 15–41)
Albumin: 4.1 g/dL (ref 3.5–5.0)
Alkaline Phosphatase: 54 U/L (ref 38–126)
Anion gap: 9 (ref 5–15)
BUN: 15 mg/dL (ref 6–20)
CO2: 23 mmol/L (ref 22–32)
Calcium: 9.1 mg/dL (ref 8.9–10.3)
Chloride: 108 mmol/L (ref 98–111)
Creatinine, Ser: 0.99 mg/dL (ref 0.61–1.24)
GFR, Estimated: >60 mL/min (ref >60)
Glucose, Bld: 96 mg/dL (ref 70–99)
Potassium: 3.9 mmol/L (ref 3.5–5.1)
Sodium: 140 mmol/L (ref 135–145)
Total Bilirubin: 0.7 mg/dL (ref 0.0–1.2)
Total Protein: 7.1 g/dL (ref 6.5–8.1)

## 2024-05-20 LAB — URINALYSIS, ROUTINE W REFLEX MICROSCOPIC
Bacteria, UA: NONE SEEN
Bilirubin Urine: NEGATIVE
Glucose, UA: NEGATIVE mg/dL
Ketones, ur: NEGATIVE mg/dL
Leukocytes,Ua: NEGATIVE
Nitrite: NEGATIVE
Protein, ur: 100 mg/dL — AB
RBC / HPF: >50 RBC/hpf (ref 0–5)
Specific Gravity, Urine: 1.023 (ref 1.005–1.030)
pH: 5 (ref 5.0–8.0)

## 2024-05-20 LAB — CBC WITH DIFFERENTIAL/PLATELET
Abs Immature Granulocytes: 0.03 10*3/uL (ref 0.00–0.07)
Basophils Absolute: 0.1 10*3/uL (ref 0.0–0.1)
Basophils Relative: 1 %
Eosinophils Absolute: 0.2 10*3/uL (ref 0.0–0.5)
Eosinophils Relative: 2 %
HCT: 40 % (ref 39.0–52.0)
Hemoglobin: 12.9 g/dL — ABNORMAL LOW (ref 13.0–17.0)
Immature Granulocytes: 0 %
Lymphocytes Relative: 17 %
Lymphs Abs: 1.7 10*3/uL (ref 0.7–4.0)
MCH: 27.4 pg (ref 26.0–34.0)
MCHC: 32.3 g/dL (ref 30.0–36.0)
MCV: 84.9 fL (ref 80.0–100.0)
Monocytes Absolute: 0.8 10*3/uL (ref 0.1–1.0)
Monocytes Relative: 8 %
Neutro Abs: 7.4 10*3/uL (ref 1.7–7.7)
Neutrophils Relative %: 72 %
Platelets: 275 10*3/uL (ref 150–400)
RBC: 4.71 MIL/uL (ref 4.22–5.81)
RDW: 14.6 % (ref 11.5–15.5)
WBC: 10.2 10*3/uL (ref 4.0–10.5)
nRBC: 0 % (ref 0.0–0.2)

## 2024-05-20 MED ORDER — NAPROXEN 250 MG PO TABS
500.0000 mg | ORAL_TABLET | Freq: Once | ORAL | Status: AC
  Administered 2024-05-20: 500 mg via ORAL
  Filled 2024-05-20: qty 2

## 2024-05-20 MED ORDER — TAMSULOSIN HCL 0.4 MG PO CAPS: 0.4000 mg | ORAL_CAPSULE | Freq: Every day | ORAL | 0 refills | Status: DC

## 2024-05-20 MED ORDER — OXYCODONE HCL 5 MG PO TABS: 5.0000 mg | ORAL_TABLET | ORAL | 0 refills | Status: DC | PRN

## 2024-05-20 MED ORDER — OXYCODONE HCL 5 MG PO TABS
5.0000 mg | ORAL_TABLET | Freq: Once | ORAL | Status: AC
  Administered 2024-05-20: 5 mg via ORAL
  Filled 2024-05-20: qty 1

## 2024-05-20 MED ORDER — NAPROXEN 500 MG PO TABS: 500.0000 mg | ORAL_TABLET | Freq: Two times a day (BID) | ORAL | 0 refills | Status: DC

## 2024-05-20 NOTE — Discharge Instructions (Signed)
 You were evaluated in the Emergency Department and after careful evaluation, we did not find any emergent condition requiring admission or further testing in the hospital.  Your exam/testing today is overall reassuring.  Symptoms seem to be due to a kidney stone.  Use Naprosyn twice daily for pain.  Can use the oxycodone for more significant pain.  Use the Flomax daily to help you pass the stone.  Follow-up with the urologists.  Plenty of fluids.  Please return to the Emergency Department if you experience any worsening of your condition.   Thank you for allowing us  to be a part of your care.

## 2024-05-20 NOTE — ED Triage Notes (Signed)
 Pt arrived via POV c/o flank pain that radiates to his groin. Pt reports dark urine today and Hx of kidney stones.

## 2024-05-20 NOTE — ED Provider Notes (Signed)
 [facility]-EMERGENCY DEPT [facility] Emergency Department Provider Note MRN:  [id number]  Arrival date & time: 05/20/24     Chief Complaint   Flank Pain   History of Present Illness   Michael Bruce is a 53 y.o. year-old male with a history of kidney stones presenting to the ED with chief complaint of flank pain.  Right flank pain for the past few days, this morning started having right lower quadrant pain that was more severe, associated with nausea and sweatiness when it was really bad and earlier today.  No fever.  Blood in urine.  Review of Systems  A thorough review of systems was obtained and all systems are negative except as noted in the HPI and PMH.   Patient's Health History    Past Medical History:  Diagnosis Date   Kidney stones     Past Surgical History:  Procedure Laterality Date   URETERAL STENT PLACEMENT      Family History  Problem Relation Age of Onset   Thyroid disease Mother    Heart disease Father        pacemaker   Diabetes Father    Heart disease Maternal Grandfather     Social History   Socioeconomic History   Marital status: Married    Spouse name: Not on file   Number of children: Not on file   Years of education: Not on file   Highest education level: Not on file  Occupational History   Not on file  Tobacco Use   Smoking status: Former    Current packs/day: 0.00    Average packs/day: 0.3 packs/day for 5.0 years (1.3 ttl pk-yrs)    Types: Cigarettes    Start date: 11/26/2010    Quit date: 11/27/2015    Years since quitting: 8.4    Passive exposure: Past   Smokeless tobacco: Never  Vaping Use   Vaping status: Former   Substances: Flavoring  Substance and Sexual Activity   Alcohol use: No   Drug use: Yes    Types: Marijuana    Comment: rarely    Sexual activity: Yes    Birth control/protection: None  Other Topics Concern   Not on file  Social History Narrative   Not on file   Social Drivers of Health   Financial Resource  Strain: Not on file  Food Insecurity: Not on file  Transportation Needs: Not on file  Physical Activity: Not on file  Stress: Not on file  Social Connections: Not on file  Intimate Partner Violence: Not on file     Physical Exam   Vitals:   05/20/24 2313 05/20/24 2313  BP: 111/70   Pulse: 91   Resp: 16   Temp:  98 F (36.7 C)  SpO2: 98%     CONSTITUTIONAL: Well-appearing, NAD NEURO/PSYCH:  Alert and oriented x 3, no focal deficits EYES:  eyes equal and reactive ENT/NECK:  no LAD, no JVD CARDIO: Regular rate, well-perfused, normal S1 and S2 PULM:  CTAB no wheezing or rhonchi GI/GU:  non-distended, non-tender MSK/SPINE:  No gross deformities, no edema SKIN:  no rash, atraumatic   *Additional and/or pertinent findings included in MDM below  Diagnostic and Interventional Summary    EKG Interpretation Date/Time:    Ventricular Rate:    PR Interval:    QRS Duration:    QT Interval:    QTC Calculation:   R Axis:      Text Interpretation:  Labs Reviewed  URINALYSIS, ROUTINE W REFLEX MICROSCOPIC - Abnormal; Notable for the following components:      Result Value   APPearance CLOUDY (*)    Hgb urine dipstick LARGE (*)    Protein, ur 100 (*)    All other components within normal limits  CBC WITH DIFFERENTIAL/PLATELET - Abnormal; Notable for the following components:   Hemoglobin 12.9 (*)    All other components within normal limits  COMPREHENSIVE METABOLIC PANEL WITH GFR    CT Renal Stone Study  Final Result      Medications  oxyCODONE (Oxy IR/ROXICODONE) immediate release tablet 5 mg (5 mg Oral Given 05/20/24 2332)  naproxen (NAPROSYN) tablet 500 mg (500 mg Oral Given 05/20/24 2331)     Procedures  /  Critical Care Procedures  ED Course and Medical Decision Making  Initial Impression and Ddx Patient well-appearing in no acute distress with normal vital signs.  Abdomen soft and nontender.  History would suggest kidney stone.  Other considerations  include appendicitis, right-sided diverticulitis, pyelonephritis.  Past medical/surgical history that increases complexity of ED encounter: Kidney stones  Interpretation of Diagnostics I personally reviewed the Laboratory Testing and my interpretation is as follows: No significant blood count or electrolyte disturbance.  CT confirms 6 mm stone  Patient Reassessment and Ultimate Disposition/Management     With reassuring workup and pain well-controlled patient is appropriate for discharge with urology follow-up.  Patient management required discussion with the following services or consulting groups:  None  Complexity of Problems Addressed Acute illness or injury that poses threat of life of bodily function  Additional Data Reviewed and Analyzed Further history obtained from: Further history from spouse/family member  Additional Factors Impacting ED Encounter Risk Prescriptions  Ozell HERO. Theadore, MD [facility] [email or web address]  Final Clinical Impressions(s) / ED Diagnoses     ICD-10-CM   1. Kidney stone  N20.0       ED Discharge Orders          Ordered    naproxen (NAPROSYN) 500 MG tablet  2 times daily        05/20/24 2335    oxyCODONE (ROXICODONE) 5 MG immediate release tablet  Every 4 hours PRN        05/20/24 2335    tamsulosin (FLOMAX) 0.4 MG CAPS capsule  Daily        05/20/24 2335             Discharge Instructions Discussed with and Provided to Patient:    Discharge Instructions      You were evaluated in the Emergency Department and after careful evaluation, we did not find any emergent condition requiring admission or further testing in the hospital.  Your exam/testing today is overall reassuring.  Symptoms seem to be due to a kidney stone.  Use Naprosyn twice daily for pain.  Can use the oxycodone for more significant pain.  Use the Flomax daily to help you pass the stone.  Follow-up with the  urologists.  Plenty of fluids.  Please return to the Emergency Department if you experience any worsening of your condition.   Thank you for allowing us  to be a part of your care.      Theadore Ozell HERO, MD 05/20/24 2337

## 2024-05-25 DIAGNOSIS — N132 Hydronephrosis with renal and ureteral calculous obstruction: Secondary | ICD-10-CM | POA: Diagnosis not present

## 2024-05-25 DIAGNOSIS — N202 Calculus of kidney with calculus of ureter: Secondary | ICD-10-CM | POA: Diagnosis not present

## 2024-05-26 ENCOUNTER — Other Ambulatory Visit: Payer: Self-pay | Admitting: Urology

## 2024-06-03 ENCOUNTER — Encounter (HOSPITAL_COMMUNITY): Payer: Self-pay | Admitting: Urology

## 2024-06-03 NOTE — Progress Notes (Signed)
 Left voicemail on both phone numbers and will attempt call back today.

## 2024-06-03 NOTE — Progress Notes (Signed)
 Left voicemail on [phone] stating arrival to main entrance of [facility] at 0600 on Monday, bring driver's license, insurance card and blue folder, do not eat or drink anything after Midnight on Sunday. ON Sunday, take laxative of choice, drink lots so well hydrated and eat light meal that evening. Stop Naprosyn  if taking, and do not take any other NSAIDS or supplements after Friday morning. Do not wear metal from your waist down. Return call number [phone] left for call back today up until 1:30.

## 2024-06-03 NOTE — Progress Notes (Signed)
 Spoke w/ via phone for pre-op interview---Michael Bruce needs dos----  KUB             COVID test -----patient states asymptomatic no test needed Arrive at -------0600 NPO after MN NO Solid Food.  Clear liquids from MN until--- Med rec completed. Pt aware to hold ASA/NSAIDs and supplements per [facility] protocol.  Medications to take morning of surgery -----oxy or Tylenol if needed for pain by 3AM Monday morning Diabetic/Weight loss medication ----- No Alcohol or recreational drugs for 24 hours/Tobacco products for 6 hours ---- Patient instructed to bring blue lithotripsy folder, photo id and insurance card day of surgery. Patient aware to have Driver (ride ) / caregiver for 24 hours after surgery -----[name] [phone] Patient Special Instructions -----Bring blue folder, wear clothes without metal buckles, buttons, or zippers.  No Aspirin or aspirin products, Advil , motrin , Aleve  products after Friday Morning. Pre-Op special Instructions ----- take laxative of choice day before procedure Patient verbalized understanding of instructions that were given at this phone interview. Patient denies shortness of breath, chest pain, fever, cough at this phone interview.

## 2024-06-08 ENCOUNTER — Other Ambulatory Visit: Payer: Self-pay

## 2024-06-08 ENCOUNTER — Ambulatory Visit (HOSPITAL_COMMUNITY)

## 2024-06-08 ENCOUNTER — Encounter (HOSPITAL_COMMUNITY): Payer: Self-pay | Admitting: Urology

## 2024-06-08 ENCOUNTER — Encounter (HOSPITAL_COMMUNITY)
Admission: RE | Disposition: A | Discharge status: Home / Self Care | Payer: Self-pay | Source: Home / Self Care | Attending: Urology

## 2024-06-08 ENCOUNTER — Ambulatory Visit (HOSPITAL_COMMUNITY)
Admission: RE | Admit: 2024-06-08 | Discharge: 2024-06-08 | Disposition: A | Discharge status: Home / Self Care | Attending: Urology | Admitting: Urology

## 2024-06-08 DIAGNOSIS — I10 Essential (primary) hypertension: Secondary | ICD-10-CM | POA: Diagnosis not present

## 2024-06-08 DIAGNOSIS — Z87442 Personal history of urinary calculi: Secondary | ICD-10-CM | POA: Diagnosis not present

## 2024-06-08 DIAGNOSIS — N201 Calculus of ureter: Secondary | ICD-10-CM | POA: Insufficient documentation

## 2024-06-08 DIAGNOSIS — N200 Calculus of kidney: Secondary | ICD-10-CM | POA: Diagnosis not present

## 2024-06-08 DIAGNOSIS — Z87891 Personal history of nicotine dependence: Secondary | ICD-10-CM | POA: Insufficient documentation

## 2024-06-08 DIAGNOSIS — Z79899 Other long term (current) drug therapy: Secondary | ICD-10-CM | POA: Insufficient documentation

## 2024-06-08 DIAGNOSIS — Z01818 Encounter for other preprocedural examination: Secondary | ICD-10-CM | POA: Diagnosis not present

## 2024-06-08 DIAGNOSIS — Z791 Long term (current) use of non-steroidal anti-inflammatories (NSAID): Secondary | ICD-10-CM | POA: Diagnosis not present

## 2024-06-08 HISTORY — DX: Personal history of urinary calculi: Z87.442

## 2024-06-08 HISTORY — PX: EXTRACORPOREAL SHOCK WAVE LITHOTRIPSY: SHX1557

## 2024-06-08 SURGERY — LITHOTRIPSY, ESWL
Anesthesia: LOCAL | Laterality: Right

## 2024-06-08 MED ORDER — SODIUM CHLORIDE 0.9 % IV SOLN
INTRAVENOUS | Status: DC
  Administered 2024-06-08: 1000 mL via INTRAVENOUS

## 2024-06-08 MED ORDER — OXYCODONE HCL 5 MG PO TABS
5.0000 mg | ORAL_TABLET | Freq: Once | ORAL | Status: AC
  Administered 2024-06-08: 5 mg via ORAL
  Filled 2024-06-08: qty 1

## 2024-06-08 MED ORDER — TRAMADOL HCL 50 MG PO TABS: 50.0000 mg | ORAL_TABLET | Freq: Four times a day (QID) | ORAL | 0 refills | Status: AC | PRN

## 2024-06-08 MED ORDER — DIPHENHYDRAMINE HCL 25 MG PO CAPS
25.0000 mg | ORAL_CAPSULE | ORAL | Status: AC
  Administered 2024-06-08: 25 mg via ORAL
  Filled 2024-06-08: qty 1

## 2024-06-08 MED ORDER — DIAZEPAM 5 MG PO TABS
10.0000 mg | ORAL_TABLET | ORAL | Status: AC
  Administered 2024-06-08: 10 mg via ORAL
  Filled 2024-06-08: qty 2

## 2024-06-08 MED ORDER — CIPROFLOXACIN HCL 500 MG PO TABS
500.0000 mg | ORAL_TABLET | ORAL | Status: AC
  Administered 2024-06-08: 500 mg via ORAL
  Filled 2024-06-08: qty 1

## 2024-06-08 MED ORDER — TAMSULOSIN HCL 0.4 MG PO CAPS: 0.4000 mg | ORAL_CAPSULE | Freq: Every day | ORAL | 0 refills | Status: DC

## 2024-06-08 NOTE — Discharge Instructions (Addendum)
1. You should strain your urine and collect all fragments and bring them to your follow up appointment.  °2. You should take your pain medication as needed.  Please call if your pain is severe to the point that it is not controlled with your pain medication. °3. You should call if you develop fever > 101 or persistent nausea or vomiting. °4. Your doctor may prescribe tamsulosin to take to help facilitate stone passage. °

## 2024-06-08 NOTE — Progress Notes (Signed)
 Right flank area with red blotchy area from Lithotripsy

## 2024-06-08 NOTE — Op Note (Signed)
 See [employer] operative note scanned into chart. Also because of the size, density, location and other factors that cannot be anticipated I feel this will likely be a staged procedure. This fact supersedes any indication in the scanned [place] stone operative note to the contrary.  Herlene Foot MD 06/08/2024, 9:18 AM  [facility]  Pager: [phone]

## 2024-06-08 NOTE — H&P (Signed)
 H&P  History of Present Illness: Michael Bruce is a 53 y.o. year old M who presents today for treatment of a right ureteral stone  No acute complaints  Past Medical History:  Diagnosis Date   History of kidney stones     Past Surgical History:  Procedure Laterality Date   URETERAL STENT PLACEMENT      Home Medications:  Current Meds  Medication Sig   naproxen  (NAPROSYN ) 500 MG tablet Take 1 tablet (500 mg total) by mouth 2 (two) times daily.   oxyCODONE  (ROXICODONE ) 5 MG immediate release tablet Take 1 tablet (5 mg total) by mouth every 4 (four) hours as needed for severe pain (pain score 7-10).   tamsulosin  (FLOMAX ) 0.4 MG CAPS capsule Take 1 capsule (0.4 mg total) by mouth daily.    Allergies:  Allergies  Allergen Reactions   Penicillins Other (See Comments)    Has patient had a PCN reaction causing immediate rash, facial/tongue/throat swelling, SOB or lightheadedness with hypotension: No Has patient had a PCN reaction causing severe rash involving mucus membranes or skin necrosis: No Has patient had a PCN reaction that required hospitalization: No Has patient had a PCN reaction occurring within the last 10 years: No If all of the above answers are NO, then may proceed with Cephalosporin use.    Family History  Problem Relation Age of Onset   Thyroid disease Mother    Heart disease Father        pacemaker   Diabetes Father    Heart disease Maternal Grandfather     Social History:  reports that he quit smoking about 8 years ago. His smoking use included cigarettes. He started smoking about 13 years ago. He has a 1.3 pack-year smoking history. He has been exposed to tobacco smoke. He has never used smokeless tobacco. He reports that he does not currently use drugs after having used the following drugs: Marijuana. He reports that he does not drink alcohol.  ROS: A complete review of systems was performed.  All systems are negative except for pertinent findings as  noted.  Physical Exam:  Vital signs in last 24 hours: Temp:  [98.4 F (36.9 C)] 98.4 F (36.9 C) (07/14 0630) Pulse Rate:  [82] 82 (07/14 0630) Resp:  [16] 16 (07/14 0630) BP: (141)/(100) 141/100 (07/14 0630) SpO2:  [98 %] 98 % (07/14 0630) Weight:  [80.7 kg] 80.7 kg (07/14 9347) Constitutional:  Alert and oriented, No acute distress Cardiovascular: Regular rate and rhythm Respiratory: Normal respiratory effort, Lungs clear bilaterally GI: Abdomen is soft, nontender, nondistended, no abdominal masses Lymphatic: No lymphadenopathy Neurologic: Grossly intact, no focal deficits Psychiatric: Normal mood and affect   Laboratory Data:  No results for input(s): WBC, HGB, HCT, PLT in the last 72 hours.  No results for input(s): NA, K, CL, GLUCOSE, BUN, CALCIUM , CREATININE in the last 72 hours.  Invalid input(s): CO3   No results found for this or any previous visit (from the past 24 hours). No results found for this or any previous visit (from the past 240 hours).  Renal Function: No results for input(s): CREATININE in the last 168 hours. Estimated Creatinine Clearance: 88.8 mL/min (by C-G formula based on SCr of 0.99 mg/dL).  Radiologic Imaging: No results found.  Assessment:  Michael Bruce is a 53 y.o. year old M with right ureteral stone   Plan:  --to OR as planned for R ESWL. Procedure and risks reviewed, including but not limited to hematuria, infection, sepsis,  damage to GU tract, failure to complete procedure, retained stone fragments, need for future procedures, steinstrasse, pain, failure to pass fragments  Herlene Foot, MD 06/08/2024, 7:34 AM  [facility] Pager: [phone]

## 2024-06-09 ENCOUNTER — Encounter (HOSPITAL_COMMUNITY): Payer: Self-pay | Admitting: Urology

## 2024-06-22 DIAGNOSIS — N202 Calculus of kidney with calculus of ureter: Secondary | ICD-10-CM | POA: Diagnosis not present

## 2024-09-28 NOTE — Progress Notes (Addendum)
 "    Subjective:  Patient ID: Michael Bruce, male    DOB: [birth year], 53 y.o.   MRN: [id number]  Patient Care Team: Deitra Morton Sebastian Nena, NP as PCP - General (Nurse Practitioner)   Chief Complaint:  Establish Care   HPI: Michael Bruce is a 53 y.o. male presenting on 09/29/2024 for Establish Care   Discussed the use of AI scribe software for clinical note transcription with the patient, who gave verbal consent to proceed.  History of Present Illness Michael Bruce is a 53 year old male who presents to reestablish care hide he has not been seen at the practice for over 3 years   He has a history of kidney stones and underwent lithotripsy in July. He was previously on tamsulosin  for this condition but is no longer taking it. He is not currently on any medications.  He has experienced sleep issues since childhood, characterized by frequent awakenings and snoring. His wife has observed episodes of apnea during sleep.  Reports always wake up tired despite getting 8 to 10 hours of sleep  He experiences dermatological issues on his feet, described as blisters and dry, cracking skin, which he attributes to wearing boots daily. He has tried a topical cream with limited success. The condition has worsened recently, with increased odor and discomfort.  No smoking or alcohol use. He feels safe at home. He has a family history of cancer, with an aunt currently undergoing treatment, but no known family history of diabetes or hypertension.    Flowsheet Row Office Visit from 05/31/2020 in [facility]  PHQ-9 Total Score 4       09/29/2024    3:09 PM 05/31/2020    9:58 AM  GAD 7 : Generalized Anxiety Score  Nervous, Anxious, on Edge 0 0  Control/stop worrying 0 0  Worry too much - different things 0 0  Trouble relaxing 0 0  Restless 0 0  Easily annoyed or irritable 0 0  Afraid - awful might happen 0 0  Total GAD 7 Score 0 0  Anxiety Difficulty Not  difficult at all Somewhat difficult      Relevant past medical, surgical, family, and social history reviewed and updated as indicated.  Allergies and medications reviewed and updated. Data reviewed: Chart in Epic.   Past Medical History:  Diagnosis Date   History of kidney stones     Past Surgical History:  Procedure Laterality Date   EXTRACORPOREAL SHOCK WAVE LITHOTRIPSY Right 06/08/2024   Procedure: LITHOTRIPSY, ESWL;  Surgeon: Lovie Arlyss CROME, MD;  Location: [facility];  Service: Urology;  Laterality: Right;   URETERAL STENT PLACEMENT      Social History   Socioeconomic History   Marital status: Married    Spouse name: Not on file   Number of children: Not on file   Years of education: Not on file   Highest education level: 12th grade  Occupational History   Not on file  Tobacco Use   Smoking status: Former    Current packs/day: 0.00    Average packs/day: 0.3 packs/day for 5.0 years (1.3 ttl pk-yrs)    Types: Cigarettes    Start date: 11/26/2010    Quit date: 11/27/2015    Years since quitting: 8.8    Passive exposure: Past   Smokeless tobacco: Never  Vaping Use   Vaping status: Former   Substances: Flavoring  Substance and Sexual Activity   Alcohol use: No  Drug use: Not Currently    Types: Marijuana    Comment: rarely    Sexual activity: Yes    Birth control/protection: None  Other Topics Concern   Not on file  Social History Narrative   Not on file   Social Drivers of Health   Financial Resource Strain: Low Risk  (09/29/2024)   Overall Financial Resource Strain (CARDIA)    Difficulty of Paying Living Expenses: Not hard at all  Food Insecurity: No Food Insecurity (09/29/2024)   Hunger Vital Sign    Worried About Running Out of Food in the Last Year: Never true    Ran Out of Food in the Last Year: Never true  Transportation Needs: No Transportation Needs (09/29/2024)   PRAPARE - Administrator, Civil Service (Medical): No    Lack of  Transportation (Non-Medical): No  Physical Activity: Sufficiently Active (09/29/2024)   Exercise Vital Sign    Days of Exercise per Week: 5 days    Minutes of Exercise per Session: 30 min  Stress: No Stress Concern Present (09/29/2024)   [employer] of Occupational Health - Occupational Stress Questionnaire    Feeling of Stress: Not at all  Social Connections: Moderately Integrated (09/29/2024)   Social Connection and Isolation Panel    Frequency of Communication with Friends and Family: Three times a week    Frequency of Social Gatherings with Friends and Family: Patient declined    Attends Religious Services: 1 to 4 times per year    Active Member of [employer] or Organizations: No    Attends Engineer, Structural: Not on file    Marital Status: Married  Intimate Partner Violence: Unknown (09/29/2024)   Humiliation, Afraid, Rape, and Kick questionnaire    Fear of Current or Ex-Partner: No    Emotionally Abused: No    Physically Abused: No    Sexually Abused: Not on file    Outpatient Encounter Medications as of 09/29/2024  Medication Sig   ketoconazole  (NIZORAL ) 2 % cream Apply topically 2 (two) times daily.   [DISCONTINUED] atorvastatin  (LIPITOR) 20 MG tablet Take 1 tablet (20 mg total) by mouth daily.   [DISCONTINUED] ketoconazole  (NIZORAL ) 2 % cream APPLY TO AFFECTED AREA EVERY DAY   [DISCONTINUED] naproxen  (NAPROSYN ) 500 MG tablet Take 1 tablet (500 mg total) by mouth 2 (two) times daily.   [DISCONTINUED] oxyCODONE  (ROXICODONE ) 5 MG immediate release tablet Take 1 tablet (5 mg total) by mouth every 4 (four) hours as needed for severe pain (pain score 7-10).   [DISCONTINUED] tamsulosin  (FLOMAX ) 0.4 MG CAPS capsule Take 1 capsule (0.4 mg total) by mouth daily.   No facility-administered encounter medications on file as of 09/29/2024.    Allergies  Allergen Reactions   Penicillins Other (See Comments)    Has patient had a PCN reaction causing immediate rash,  facial/tongue/throat swelling, SOB or lightheadedness with hypotension: No Has patient had a PCN reaction causing severe rash involving mucus membranes or skin necrosis: No Has patient had a PCN reaction that required hospitalization: No Has patient had a PCN reaction occurring within the last 10 years: No If all of the above answers are NO, then may proceed with Cephalosporin use.    Pertinent ROS per HPI, otherwise unremarkable      Objective:  BP 130/78   Pulse 78   Temp (!) 97.3 F (36.3 C) (Oral)   Ht 5' 7 (1.702 m)   Wt 198 lb 6.4 oz (90 kg)   SpO2 97%  BMI 31.07 kg/m    Wt Readings from Last 3 Encounters:  09/29/24 198 lb 6.4 oz (90 kg)  06/08/24 178 lb (80.7 kg)  05/20/24 178 lb 12.7 oz (81.1 kg)    Physical Exam Vitals and nursing note reviewed.  Constitutional:      Appearance: He is obese.  HENT:     Head: Normocephalic and atraumatic.     Right Ear: Tympanic membrane, ear canal and external ear normal. There is no impacted cerumen.     Left Ear: Tympanic membrane, ear canal and external ear normal. There is no impacted cerumen.  Eyes:     General: No scleral icterus.    Extraocular Movements: Extraocular movements intact.     Conjunctiva/sclera: Conjunctivae normal.     Pupils: Pupils are equal, round, and reactive to light.  Neck:     Vascular: No carotid bruit.  Cardiovascular:     Heart sounds: Normal heart sounds.  Pulmonary:     Effort: Pulmonary effort is normal.     Breath sounds: Normal breath sounds.  Abdominal:     General: Bowel sounds are normal.     Palpations: Abdomen is soft.  Musculoskeletal:        General: Normal range of motion.     Cervical back: Normal range of motion and neck supple. No rigidity or tenderness.     Right lower leg: No edema.     Left lower leg: No edema.     Right foot: No swelling. Normal pulse.     Left foot: No swelling. Normal pulse.     Comments: Athlete foot  Lymphadenopathy:     Cervical: No  cervical adenopathy.  Skin:    General: Skin is warm and dry.     Findings: Lesion present.         Comments: Fungal rash between toes bilateral  Neurological:     Mental Status: He is alert.  Psychiatric:        Attention and Perception: Attention and perception normal.        Mood and Affect: Mood and affect normal.        Speech: Speech normal.        Behavior: Behavior normal. Behavior is cooperative.        Thought Content: Thought content normal. Thought content does not include homicidal or suicidal ideation. Thought content does not include homicidal or suicidal plan.        Cognition and Memory: Cognition and memory normal.        Judgment: Judgment normal.    Physical Exam      Results for orders placed or performed during the hospital encounter of 05/20/24  Urinalysis, Routine w reflex microscopic -Urine, Clean Catch   Collection Time: 05/20/24  7:02 PM  Result Value Ref Range   Color, Urine YELLOW YELLOW   APPearance CLOUDY (A) CLEAR   Specific Gravity, Urine 1.023 1.005 - 1.030   pH 5.0 5.0 - 8.0   Glucose, UA NEGATIVE NEGATIVE mg/dL   Hgb urine dipstick LARGE (A) NEGATIVE   Bilirubin Urine NEGATIVE NEGATIVE   Ketones, ur NEGATIVE NEGATIVE mg/dL   Protein, ur 899 (A) NEGATIVE mg/dL   Nitrite NEGATIVE NEGATIVE   Leukocytes,Ua NEGATIVE NEGATIVE   RBC / HPF >50 0 - 5 RBC/hpf   WBC, UA 0-5 0 - 5 WBC/hpf   Bacteria, UA NONE SEEN NONE SEEN   Squamous Epithelial / HPF 0-5 0 - 5 /HPF   Mucus PRESENT  Budding Yeast PRESENT   Comprehensive metabolic panel   Collection Time: 05/20/24  7:19 PM  Result Value Ref Range   Sodium 140 135 - 145 mmol/L   Potassium 3.9 3.5 - 5.1 mmol/L   Chloride 108 98 - 111 mmol/L   CO2 23 22 - 32 mmol/L   Glucose, Bld 96 70 - 99 mg/dL   BUN 15 6 - 20 mg/dL   Creatinine, Ser 9.00 0.61 - 1.24 mg/dL   Calcium  9.1 8.9 - 10.3 mg/dL   Total Protein 7.1 6.5 - 8.1 g/dL   Albumin 4.1 3.5 - 5.0 g/dL   AST 22 15 - 41 U/L   ALT 29 0 - 44  U/L   Alkaline Phosphatase 54 38 - 126 U/L   Total Bilirubin 0.7 0.0 - 1.2 mg/dL   GFR, Estimated >39 >39 mL/min   Anion gap 9 5 - 15  CBC with Differential   Collection Time: 05/20/24  7:19 PM  Result Value Ref Range   WBC 10.2 4.0 - 10.5 K/uL   RBC 4.71 4.22 - 5.81 MIL/uL   Hemoglobin 12.9 (L) 13.0 - 17.0 g/dL   HCT 59.9 60.9 - 47.9 %   MCV 84.9 80.0 - 100.0 fL   MCH 27.4 26.0 - 34.0 pg   MCHC 32.3 30.0 - 36.0 g/dL   RDW 85.3 88.4 - 84.4 %   Platelets 275 150 - 400 K/uL   nRBC 0.0 0.0 - 0.2 %   Neutrophils Relative % 72 %   Neutro Abs 7.4 1.7 - 7.7 K/uL   Lymphocytes Relative 17 %   Lymphs Abs 1.7 0.7 - 4.0 K/uL   Monocytes Relative 8 %   Monocytes Absolute 0.8 0.1 - 1.0 K/uL   Eosinophils Relative 2 %   Eosinophils Absolute 0.2 0.0 - 0.5 K/uL   Basophils Relative 1 %   Basophils Absolute 0.1 0.0 - 0.1 K/uL   Immature Granulocytes 0 %   Abs Immature Granulocytes 0.03 0.00 - 0.07 K/uL       Pertinent labs & imaging results that were available during my care of the patient were reviewed by me and considered in my medical decision making.  Assessment & Plan:  Michael Bruce was seen today for establish care.  Diagnoses and all orders for this visit:  Encounter for general adult medical examination with abnormal findings -     CBC with Differential/Platelet -     CMP14+EGFR -     HepB+HepC+HIV Panel -     Thyroid  Panel With TSH -     PSA, total and free -     Lipid panel -     VITAMIN D  25 Hydroxy (Vit-D Deficiency, Fractures) -     Ambulatory referral to Sleep Studies -     Home sleep test -     Cologuard -     Bayer DCA Hb A1c Waived  Snoring -     Home sleep test  Nocturia -     PSA, total and free  Vitamin D  deficiency -     VITAMIN D  25 Hydroxy (Vit-D Deficiency, Fractures)  Tinea pedis of both feet  Hypertriglyceridemia -     Thyroid  Panel With TSH  Screening for colon cancer -     Cologuard  Class 2 obesity without serious comorbidity with body  mass index (BMI) of 37.0 to 37.9 in adult, unspecified obesity type -     Bayer DCA Hb A1c Waived  Other orders -  ketoconazole  (NIZORAL ) 2 % cream; Apply topically 2 (two) times daily.     Assessment and Plan Michael Bruce is a 53 year old male seen today to reestablish care, no acute distress Assessment & Plan Snoring and suspected sleep apnea Chronic snoring with apnea episodes causing daytime fatigue. Suspected sleep apnea due to poor sleep quality. - Order home sleep study to evaluate for sleep apnea.  Tinea pedis (athlete's foot) Chronic tinea pedis with dry, cracking skin and blisters, exacerbated by boot wear. Previous topical treatment provided partial relief. - Prescribe ketoconazole  cream to apply between toes twice daily for 1-2 weeks. - Advise keeping feet dry, changing socks daily, and airing feet out when possible. - Recommend soaking feet in warm water with Epsom salt for 15-20 minutes after work. Apply 1-2 cm beyond the edge of the rash. Continue treatment 1 week after symptoms resolve to prevent recurrence  Adult Wellness Visit Annual physical examination to assess overall health. No current medications, smoking, or alcohol use. Feels safe at home. No family history of diabetes or hypertension. Aunt with unspecified cancer. - Perform blood work to check kidney function, thyroid  function, lipid panel, vitamin D  levels, and PSA.  Screening for malignant neoplasm of colon Due for colon cancer screening. Prefers non-invasive testing. Discussed Cologuard test with 99.9% accuracy. If positive, follow-up with colonoscopy required. - Order Cologuard test for colon cancer screening.  Labs: CBC, CMP, lipid, PSA, vitamin D , TSH, A1c result pending  Health maintenance: Cologuard ordered, Tdap administered  Continue all other maintenance medications.  Follow up plan: Return in about 1 year (around 09/29/2025) for physical with labs.   Continue healthy lifestyle choices,  including diet (rich in fruits, vegetables, and lean proteins, and low in salt and simple carbohydrates) and exercise (at least 30 minutes of moderate physical activity daily).  Educational handout given for    Clinical References  BMI for Adults Body mass index (BMI) is a number found using a person's weight and height. BMI can help tell how much of a person's weight is made up of fat. BMI does not measure body fat directly. It is used instead of tests that directly measure body fat, which can be difficult and expensive. What are BMI measurements used for? BMI is useful to: Find out if your weight puts you at higher risk for medical problems. Help recommend changes, such as in diet and exercise. This can help you reach a healthy weight. BMI screening can be done again to see if these changes are working. How is BMI calculated? Your height and weight are measured. The BMI is found from those numbers. This can be done with U.S[place] or metric measurements. Note that charts and online BMI calculators are available to help you find your BMI quickly and easily without doing these calculations. To calculate your BMI in U.[place] measurements: Measure your weight in pounds (lb). Multiply the number of pounds by 703. So, for an adult who weighs 150 lb, multiply that number by 703: 150 x 703, which equals 105,450. Measure your height in inches. Then multiply that number by itself to get a measurement called inches squared. So, for an adult who is 70 inches tall, the inches squared measurement is 70 inches x 70 inches, which equals 4,900 inches squared. Divide the total from step 2 (number of lb x 703) by the total from step 3 (inches squared): 105,450  4,900 = 21.5. This is your BMI. To calculate your BMI in metric measurements:  Measure your weight in kilograms (kg).  For this example, the weight is 70 kg. Measure your height in meters (m). Then multiply that number by itself to get a measurement called  meters squared. So, for an adult who is 1.75 m tall, the meters squared measurement is 1.75 m x 1.75 m, which equals 3.1 meters squared. Divide the number of kilograms (your weight) by the meters squared number. In this example: 70  3.1 = 22.6. This is your BMI. What do the results mean? BMI charts are used to see if you are underweight, normal weight, overweight, or obese. The following guidelines will be used: Underweight: BMI less than 18.5. Normal weight: BMI between 18.5 and 24.9. Overweight: BMI between 25 and 29.9. Obese: BMI of 30 or above. BMI is a tool and cannot diagnose a condition. Talk with your health care provider about what your BMI means for you. Keep these notes in mind: Weight includes fat and muscle. Someone with a muscular build, such as an athlete, may have a BMI that is higher than 24.9. In cases like these, BMI is not a correct measure of body fat. If you have a BMI of 25 or higher, your provider may need to do more testing to find out if excess body fat is the cause. BMI is measured the same way for males and females. Females usually have more body fat than males of the same height and weight. Where to find more information For more information about BMI, including tools to quickly find your BMI, go to: Centers for Disease Control and Prevention: tonerpromos.no American Heart Association: heart.org National Heart, Lung, and Blood Institute: buffalodrycleaner.gl This information is not intended to replace advice given to you by your health care provider. Make sure you discuss any questions you have with your health care provider. Document Revised: 08/02/2022 Document Reviewed: 07/26/2022 Elsevier Patient Education  2024 Elsevier Inc. Obesity, Adult Obesity is the condition of having too much total body fat. Being overweight or obese means that your weight is greater than what is considered healthy for your body size. Obesity is determined by a measurement called BMI (body mass  index). BMI is an estimate of body fat and is calculated from height and weight. For adults, a BMI of 30 or higher is considered obese. Obesity can lead to other health concerns and major illnesses, including: Stroke. Coronary artery disease (CAD). Type 2 diabetes. Some types of cancer, including cancers of the colon, breast, uterus, and gallbladder. High blood pressure (hypertension). High cholesterol. Gallbladder stones. Obesity can also contribute to: Osteoarthritis. Sleep apnea. Infertility problems. What are the causes? Common causes of this condition include: Eating daily meals that are high in calories, sugar, and fat. Drinking high amounts of sugar-sweetened beverages, such as soft drinks. Being born with genes that may make you more likely to become obese. Having a medical condition that causes obesity, including: Hypothyroidism. Polycystic ovarian syndrome (PCOS). Binge-eating disorder. Cushing syndrome. Taking certain medicines, such as steroids, antidepressants, and seizure medicines. Not being physically active (sedentary lifestyle). Not getting enough sleep. What increases the risk? The following factors may make you more likely to develop this condition: Having a family history of obesity. Living in an area with limited access to: [place], recreation centers, or sidewalks. Healthy food choices, such as grocery stores and farmers' markets. What are the signs or symptoms? The main sign of this condition is having too much body fat. How is this diagnosed? This condition is diagnosed based on: Your BMI. If you are an adult with  a BMI of 30 or higher, you are considered obese. Your waist circumference. This measures the distance around your waistline. Your skinfold thickness. Your health care provider may gently pinch a fold of your skin and measure it. You may have other tests to check for underlying conditions. How is this treated? Treatment for this condition often  includes changing your lifestyle. Treatment may include some or all of the following: Dietary changes. This may include developing a healthy meal plan. Regular physical activity. This may include activity that causes your heart to beat faster (aerobic exercise) and strength training. Work with your health care provider to design an exercise program that works for you. Medicine to help you lose weight if you are unable to lose one pound a week after six weeks of healthy eating and more physical activity. Treating conditions that cause the obesity (underlying conditions). Surgery. Surgical options may include gastric banding and gastric bypass. Surgery may be done if: Other treatments have not helped to improve your condition. You have a BMI of 40 or higher. You have life-threatening health problems related to obesity. Follow these instructions at home: Eating and drinking  Follow recommendations from your health care provider about what you eat and drink. Your health care provider may advise you to: Limit fast food, sweets, and processed snack foods. Choose low-fat options, such as low-fat milk instead of whole milk. Eat five or more servings of fruits or vegetables every day. Choose healthy foods when you eat out. Keep low-fat snacks available. Limit sugary drinks, such as soda, fruit juice, sweetened iced tea, and flavored milk. Drink enough water to keep your urine pale yellow. Do not follow a fad diet. Fad diets can be unhealthy and even dangerous. Other healthful choices include: Eat at home more often. This gives you more control over what you eat. Learn to read food labels. This will help you understand how much food is considered one serving. Learn what a healthy serving size is. Physical activity Exercise regularly, as told by your health care provider. Most adults should get up to 150 minutes of moderate-intensity exercise every week. Ask your health care provider what types of  exercise are safe for you and how often you should exercise. Warm up and stretch before being active. Cool down and stretch after being active. Rest between periods of activity. Lifestyle Work with your health care provider and a dietitian to set a weight-loss goal that is healthy and reasonable for you. Limit your screen time. Find ways to reward yourself that do not involve food. Do not drink alcohol if: Your health care provider tells you not to drink. You are pregnant, may be pregnant, or are planning to become pregnant. If you drink alcohol: Limit how much you have to: 0-1 drink a day for women. 0-2 drinks a day for men. Know how much alcohol is in your drink. In the [place] one drink equals one 12 oz bottle of beer (355 mL), one 5 oz glass of wine (148 mL), or one 1 oz glass of hard liquor (44 mL). General instructions Keep a weight-loss journal to keep track of the food you eat and how much exercise you get. Take over-the-counter and prescription medicines only as told by your health care provider. Take vitamins and supplements only as told by your health care provider. Consider joining a support group. Your health care provider may be able to recommend a support group. Pay attention to your mental health as obesity can lead to depression  or self esteem issues. Keep all follow-up visits. This is important. Contact a health care provider if: You are unable to meet your weight-loss goal after six weeks of dietary and lifestyle changes. You have trouble breathing. Summary Obesity is the condition of having too much total body fat. Being overweight or obese means that your weight is greater than what is considered healthy for your body size. Work with your health care provider and a dietitian to set a weight-loss goal that is healthy and reasonable for you. Exercise regularly, as told by your health care provider. Ask your health care provider what types of exercise are safe for you  and how often you should exercise. This information is not intended to replace advice given to you by your health care provider. Make sure you discuss any questions you have with your health care provider. Document Revised: 06/20/2021 Document Reviewed: 06/20/2021 Elsevier Patient Education  2024 [employer]. Preventive Care 35-53 Years Old, Male Preventive care refers to lifestyle choices and visits with your health care provider that can promote health and wellness. Preventive care visits are also called wellness exams. What can I expect for my preventive care visit? Counseling During your preventive care visit, your health care provider may ask about your: Medical history, including: Past medical problems. Family medical history. Current health, including: Emotional well-being. Home life and relationship well-being. Sexual activity. Lifestyle, including: Alcohol, nicotine or tobacco, and drug use. Access to firearms. Diet, exercise, and sleep habits. Safety issues such as seatbelt and bike helmet use. Sunscreen use. Work and work astronomer. Physical exam Your health care provider will check your: Height and weight. These may be used to calculate your BMI (body mass index). BMI is a measurement that tells if you are at a healthy weight. Waist circumference. This measures the distance around your waistline. This measurement also tells if you are at a healthy weight and may help predict your risk of certain diseases, such as type 2 diabetes and high blood pressure. Heart rate and blood pressure. Body temperature. Skin for abnormal spots. What immunizations do I need?  Vaccines are usually given at various ages, according to a schedule. Your health care provider will recommend vaccines for you based on your age, medical history, and lifestyle or other factors, such as travel or where you work. What tests do I need? Screening Your health care provider may recommend screening  tests for certain conditions. This may include: Lipid and cholesterol levels. Diabetes screening. This is done by checking your blood sugar (glucose) after you have not eaten for a while (fasting). Hepatitis B test. Hepatitis C test. HIV (human immunodeficiency virus) test. STI (sexually transmitted infection) testing, if you are at risk. Lung cancer screening. Prostate cancer screening. Colorectal cancer screening. Talk with your health care provider about your test results, treatment options, and if necessary, the need for more tests. Follow these instructions at home: Eating and drinking  Eat a diet that includes fresh fruits and vegetables, whole grains, lean protein, and low-fat dairy products. Take vitamin and mineral supplements as recommended by your health care provider. Do not drink alcohol if your health care provider tells you not to drink. If you drink alcohol: Limit how much you have to 0-2 drinks a day. Know how much alcohol is in your drink. In the [place] one drink equals one 12 oz bottle of beer (355 mL), one 5 oz glass of wine (148 mL), or one 1 oz glass of hard liquor (44 mL). Lifestyle  Brush your teeth every morning and night with fluoride toothpaste. Floss one time each day. Exercise for at least 30 minutes 5 or more days each week. Do not use any products that contain nicotine or tobacco. These products include cigarettes, chewing tobacco, and vaping devices, such as e-cigarettes. If you need help quitting, ask your health care provider. Do not use drugs. If you are sexually active, practice safe sex. Use a condom or other form of protection to prevent STIs. Take aspirin only as told by your health care provider. Make sure that you understand how much to take and what form to take. Work with your health care provider to find out whether it is safe and beneficial for you to take aspirin daily. Find healthy ways to manage stress, such as: Meditation, yoga, or listening  to music. Journaling. Talking to a trusted person. Spending time with friends and family. Minimize exposure to UV radiation to reduce your risk of skin cancer. Safety Always wear your seat belt while driving or riding in a vehicle. Do not drive: If you have been drinking alcohol. Do not ride with someone who has been drinking. When you are tired or distracted. While texting. If you have been using any mind-altering substances or drugs. Wear a helmet and other protective equipment during sports activities. If you have firearms in your house, make sure you follow all gun safety procedures. What's next? Go to your health care provider once a year for an annual wellness visit. Ask your health care provider how often you should have your eyes and teeth checked. Stay up to date on all vaccines. This information is not intended to replace advice given to you by your health care provider. Make sure you discuss any questions you have with your health care provider. Document Revised: 05/10/2021 Document Reviewed: 05/10/2021 Elsevier Patient Education  2024 [employer] Dyslipidemia Dyslipidemia is an imbalance of waxy, fat-like substances (lipids) in the blood. The body needs lipids in small amounts. Dyslipidemia often involves a high level of cholesterol or triglycerides, which are types of lipids. Common forms of dyslipidemia include: High levels of LDL cholesterol. LDL is the type of cholesterol that causes fatty deposits (plaques) to build up in the blood vessels that carry blood away from the heart (arteries). Low levels of HDL cholesterol. HDL cholesterol is the type of cholesterol that protects against heart disease. High levels of HDL remove the LDL buildup from arteries. High levels of triglycerides. Triglycerides are a fatty substance in the blood that is linked to a buildup of plaques in the arteries. What are the causes? There are two main types of dyslipidemia: primary and secondary.  Primary dyslipidemia is caused by changes (mutations) in genes that are passed down through families (inherited). These mutations cause several types of dyslipidemia. Secondary dyslipidemia may be caused by various risk factors that can lead to the disease, such as lifestyle choices and certain medical conditions. What increases the risk? You are more likely to develop this condition if you are an older man or if you are a woman who has gone through menopause. Other risk factors include: Having a family history of dyslipidemia. Taking certain medicines, including birth control pills, steroids, some diuretics, and beta-blockers. Eating a diet high in saturated fat. Smoking cigarettes or excessive alcohol intake. Having certain medical conditions such as diabetes, polycystic ovary syndrome (PCOS), kidney disease, liver disease, or hypothyroidism. Not exercising regularly. Being overweight or obese with too much belly fat. What are the signs or  symptoms? In most cases, dyslipidemia does not usually cause any symptoms. In severe cases, very high lipid levels can cause: Fatty bumps under the skin (xanthomas). A white or gray ring around the black center (pupil) of the eye. Very high triglyceride levels can cause inflammation of the pancreas (pancreatitis). How is this diagnosed? Your health care provider may diagnose dyslipidemia based on a routine blood test (fasting blood test). Because most people do not have symptoms of the condition, this blood testing (lipid profile) is done on adults age 60 and older and is repeated every 4-6 years. This test checks: Total cholesterol. This measures the total amount of cholesterol in your blood, including LDL cholesterol, HDL cholesterol, and triglycerides. A healthy number is below 200 mg/dL (4.82 mmol/L). LDL cholesterol. The target number for LDL cholesterol is different for each person, depending on individual risk factors. A healthy number is usually below  100 mg/dL (7.40 mmol/L). Ask your health care provider what your LDL cholesterol should be. HDL cholesterol. An HDL level of 60 mg/dL (8.44 mmol/L) or higher is best because it helps to protect against heart disease. A number below 40 mg/dL (8.96 mmol/L) for men or below 50 mg/dL (8.70 mmol/L) for women increases the risk for heart disease. Triglycerides. A healthy triglyceride number is below 150 mg/dL (8.30 mmol/L). If your lipid profile is abnormal, your health care provider may do other blood tests. How is this treated? Treatment depends on the type of dyslipidemia that you have and your other risk factors for heart disease and stroke. Your health care provider will have a target range for your lipid levels based on this information. Treatment for dyslipidemia starts with lifestyle changes, such as diet and exercise. Your health care provider may recommend that you: Get regular exercise. Make changes to your diet. Quit smoking if you smoke. Limit your alcohol intake. If diet changes and exercise do not help you reach your goals, your health care provider may also prescribe medicine to lower lipids. The most commonly prescribed type of medicine lowers your LDL cholesterol (statin drug). If you have a high triglyceride level, your provider may prescribe another type of drug (fibrate) or an omega-3 fish oil supplement, or both. Follow these instructions at home: Eating and drinking  Follow instructions from your health care provider or dietitian about eating or drinking restrictions. Eat a healthy diet as told by your health care provider. This can help you reach and maintain a healthy weight, lower your LDL cholesterol, and raise your HDL cholesterol. This may include: Limiting your calories, if you are overweight. Eating more fruits, vegetables, whole grains, fish, and lean meats. Limiting saturated fat, trans fat, and cholesterol. Do not drink alcohol if: Your health care provider tells you  not to drink. You are pregnant, may be pregnant, or are planning to become pregnant. If you drink alcohol: Limit how much you have to: 0-1 drink a day for women. 0-2 drinks a day for men. Know how much alcohol is in your drink. In the [place] one drink equals one 12 oz bottle of beer (355 mL), one 5 oz glass of wine (148 mL), or one 1 oz glass of hard liquor (44 mL). Activity Get regular exercise. Start an exercise and strength training program as told by your health care provider. Ask your health care provider what activities are safe for you. Your health care provider may recommend: 30 minutes of aerobic activity 4-6 days a week. Brisk walking is an example of aerobic activity.  Strength training 2 days a week. General instructions Do not use any products that contain nicotine or tobacco. These products include cigarettes, chewing tobacco, and vaping devices, such as e-cigarettes. If you need help quitting, ask your health care provider. Take over-the-counter and prescription medicines only as told by your health care provider. This includes supplements. Keep all follow-up visits. This is important. Contact a health care provider if: You are having trouble sticking to your exercise or diet plan. You are struggling to quit smoking or to control your use of alcohol. Summary Dyslipidemia often involves a high level of cholesterol or triglycerides, which are types of lipids. Treatment depends on the type of dyslipidemia that you have and your other risk factors for heart disease and stroke. Treatment for dyslipidemia starts with lifestyle changes, such as diet and exercise. Your health care provider may prescribe medicine to lower lipids. This information is not intended to replace advice given to you by your health care provider. Make sure you discuss any questions you have with your health care provider. Document Revised: 06/15/2022 Document Reviewed: 01/16/2021 Elsevier Patient Education   [employer].  The above assessment and management plan was discussed with the patient. The patient verbalized understanding of and has agreed to the management plan. Patient is aware to call the clinic if they develop any new symptoms or if symptoms persist or worsen. Patient is aware when to return to the clinic for a follow-up visit. Patient educated on when it is appropriate to go to the emergency department.   Shawan Tosh [place] Thompson, DNP [facility] [address] [place], [place] [address] [phone]   "

## 2024-09-29 ENCOUNTER — Ambulatory Visit: Payer: Self-pay | Admitting: Nurse Practitioner

## 2024-09-29 VITALS — BP 130/78 | HR 78 | Temp 97.3°F | Ht 67.0 in | Wt 198.4 lb

## 2024-09-29 DIAGNOSIS — B353 Tinea pedis: Secondary | ICD-10-CM

## 2024-09-29 DIAGNOSIS — Z23 Encounter for immunization: Secondary | ICD-10-CM

## 2024-09-29 DIAGNOSIS — Z0001 Encounter for general adult medical examination with abnormal findings: Secondary | ICD-10-CM | POA: Diagnosis not present

## 2024-09-29 DIAGNOSIS — E559 Vitamin D deficiency, unspecified: Secondary | ICD-10-CM

## 2024-09-29 DIAGNOSIS — Z6837 Body mass index (BMI) 37.0-37.9, adult: Secondary | ICD-10-CM

## 2024-09-29 DIAGNOSIS — Z1211 Encounter for screening for malignant neoplasm of colon: Secondary | ICD-10-CM

## 2024-09-29 DIAGNOSIS — R0683 Snoring: Secondary | ICD-10-CM | POA: Diagnosis not present

## 2024-09-29 DIAGNOSIS — E781 Pure hyperglyceridemia: Secondary | ICD-10-CM

## 2024-09-29 DIAGNOSIS — E66812 Obesity, class 2: Secondary | ICD-10-CM

## 2024-09-29 DIAGNOSIS — R351 Nocturia: Secondary | ICD-10-CM

## 2024-09-29 LAB — LIPID PANEL

## 2024-09-29 LAB — BAYER DCA HB A1C WAIVED: HB A1C (BAYER DCA - WAIVED): 4.3 % — ABNORMAL LOW (ref 4.8–5.6)

## 2024-09-29 MED ORDER — KETOCONAZOLE 2 % EX CREA: TOPICAL_CREAM | Freq: Two times a day (BID) | CUTANEOUS | 2 refills | Status: AC

## 2024-09-29 NOTE — Addendum Note (Signed)
 Addended by: MAR CHIQUITA HERO on: 09/29/2024 04:20 PM   Modules accepted: Orders

## 2024-09-30 ENCOUNTER — Ambulatory Visit: Payer: Self-pay | Admitting: Nurse Practitioner

## 2024-09-30 ENCOUNTER — Telehealth: Payer: Self-pay

## 2024-09-30 DIAGNOSIS — E559 Vitamin D deficiency, unspecified: Secondary | ICD-10-CM

## 2024-09-30 LAB — HEPB+HEPC+HIV PANEL
HIV Screen 4th Generation wRfx: NONREACTIVE
Hep B E Ab: NONREACTIVE
Hep B Surface Ab, Qual: NONREACTIVE
Hep C Virus Ab: NONREACTIVE

## 2024-09-30 LAB — THYROID PANEL WITH TSH
Free Thyroxine Index: 1.4 (ref 1.2–4.9)
T3 Uptake Ratio: 24 % (ref 24–39)
T4, Total: 5.7 ug/dL (ref 4.5–12.0)
TSH: 2.59 u[IU]/mL (ref 0.450–4.500)

## 2024-09-30 LAB — CMP14+EGFR
ALT: 29 IU/L (ref 0–44)
AST: 18 IU/L (ref 0–40)
Albumin: 4.6 g/dL (ref 3.8–4.9)
Alkaline Phosphatase: 72 IU/L (ref 47–123)
BUN/Creatinine Ratio: 16 (ref 9–20)
BUN: 15 mg/dL (ref 6–24)
Bilirubin Total: 0.3 mg/dL (ref 0.0–1.2)
CO2: 19 mmol/L — AB (ref 20–29)
Calcium: 9.6 mg/dL (ref 8.7–10.2)
Chloride: 102 mmol/L (ref 96–106)
Creatinine, Ser: 0.93 mg/dL (ref 0.76–1.27)
Globulin, Total: 2.4 g/dL (ref 1.5–4.5)
Glucose: 75 mg/dL (ref 70–99)
Potassium: 4.9 mmol/L (ref 3.5–5.2)
Sodium: 137 mmol/L (ref 134–144)
Total Protein: 7 g/dL (ref 6.0–8.5)
eGFR: 99 mL/min/1.73 (ref >59)

## 2024-09-30 LAB — CBC WITH DIFFERENTIAL/PLATELET
Basophils Absolute: 0.1 x10E3/uL (ref 0.0–0.2)
Basos: 1 %
EOS (ABSOLUTE): 0.3 x10E3/uL (ref 0.0–0.4)
Eos: 3 %
Hematocrit: 44.2 % (ref 37.5–51.0)
Hemoglobin: 13.6 g/dL (ref 13.0–17.7)
Immature Grans (Abs): 0 x10E3/uL (ref 0.0–0.1)
Immature Granulocytes: 0 %
Lymphocytes Absolute: 3.5 x10E3/uL — ABNORMAL HIGH (ref 0.7–3.1)
Lymphs: 38 %
MCH: 26.6 pg (ref 26.6–33.0)
MCHC: 30.8 g/dL — ABNORMAL LOW (ref 31.5–35.7)
MCV: 87 fL (ref 79–97)
Monocytes Absolute: 0.7 x10E3/uL (ref 0.1–0.9)
Monocytes: 8 %
Neutrophils Absolute: 4.5 x10E3/uL (ref 1.4–7.0)
Neutrophils: 50 %
Platelets: 299 x10E3/uL (ref 150–450)
RBC: 5.11 x10E6/uL (ref 4.14–5.80)
RDW: 13.5 % (ref 11.6–15.4)
WBC: 9.1 x10E3/uL (ref 3.4–10.8)

## 2024-09-30 LAB — LIPID PANEL
Cholesterol, Total: 184 mg/dL (ref 100–199)
HDL: 25 mg/dL — AB (ref >39)
LDL CALC COMMENT:: 7.4 ratio — AB (ref 0.0–5.0)
LDL Chol Calc (NIH): 115 mg/dL — AB (ref 0–99)
Triglycerides: 248 mg/dL — AB (ref 0–149)
VLDL Cholesterol Cal: 44 mg/dL — AB (ref 5–40)

## 2024-09-30 LAB — PSA, TOTAL AND FREE
PSA, Free Pct: 26.2 %
PSA, Free: 0.34 ng/mL
Prostate Specific Ag, Serum: 1.3 ng/mL (ref 0.0–4.0)

## 2024-09-30 LAB — VITAMIN D 25 HYDROXY (VIT D DEFICIENCY, FRACTURES): Vit D, 25-Hydroxy: 19.2 ng/mL — AB (ref 30.0–100.0)

## 2024-09-30 MED ORDER — VITAMIN D3 125 MCG (5000 UT) PO CAPS: 5000.0000 [IU] | ORAL_CAPSULE | Freq: Every day | ORAL | 1 refills | Status: AC

## 2024-09-30 MED ORDER — PRAVASTATIN SODIUM 10 MG PO TABS: 10.0000 mg | ORAL_TABLET | Freq: Every day | ORAL | 3 refills | Status: AC

## 2024-09-30 NOTE — Telephone Encounter (Signed)
 Copied from CRM [id number]. Topic: Clinical - Lab/Test Results >> Sep 30, 2024 11:05 AM Harlene ORN wrote: Reason for CRM: Patient called to follow up on test results that he took on 09/29/2024

## 2024-09-30 NOTE — Telephone Encounter (Signed)
 Pt will be contacted once provider has reviewed results

## 2024-10-05 ENCOUNTER — Other Ambulatory Visit: Payer: Self-pay | Admitting: Nurse Practitioner

## 2024-10-05 DIAGNOSIS — R0683 Snoring: Secondary | ICD-10-CM

## 2024-10-25 LAB — COLOGUARD: COLOGUARD: NEGATIVE
